# Patient Record
Sex: Male | Born: 1960 | Race: White | Hispanic: No | Marital: Married | Smoking: Never smoker
Health system: Southern US, Community
[De-identification: ages and names within clinical notes are randomized; demographics above are authoritative.]

## PROBLEM LIST (undated history)

## (undated) DIAGNOSIS — M1990 Unspecified osteoarthritis, unspecified site: Secondary | ICD-10-CM

## (undated) DIAGNOSIS — F419 Anxiety disorder, unspecified: Secondary | ICD-10-CM

## (undated) DIAGNOSIS — I10 Essential (primary) hypertension: Secondary | ICD-10-CM

## (undated) HISTORY — DX: Unspecified osteoarthritis, unspecified site: M19.90

## (undated) HISTORY — DX: Anxiety disorder, unspecified: F41.9

## (undated) HISTORY — DX: Essential (primary) hypertension: I10

---

## 2008-04-25 ENCOUNTER — Emergency Department (HOSPITAL_BASED_OUTPATIENT_CLINIC_OR_DEPARTMENT_OTHER): Admission: EM | Admit: 2008-04-25 | Discharge: 2008-04-25 | Payer: Self-pay | Admitting: Emergency Medicine

## 2009-09-30 ENCOUNTER — Emergency Department (HOSPITAL_BASED_OUTPATIENT_CLINIC_OR_DEPARTMENT_OTHER): Admission: EM | Admit: 2009-09-30 | Discharge: 2009-09-30 | Payer: Self-pay | Admitting: Emergency Medicine

## 2009-09-30 ENCOUNTER — Ambulatory Visit: Payer: Self-pay | Admitting: Diagnostic Radiology

## 2010-04-16 ENCOUNTER — Ambulatory Visit: Payer: Self-pay | Admitting: Diagnostic Radiology

## 2010-04-16 ENCOUNTER — Emergency Department (HOSPITAL_BASED_OUTPATIENT_CLINIC_OR_DEPARTMENT_OTHER): Admission: EM | Admit: 2010-04-16 | Discharge: 2010-04-16 | Payer: Self-pay | Admitting: Emergency Medicine

## 2010-07-12 ENCOUNTER — Emergency Department (HOSPITAL_BASED_OUTPATIENT_CLINIC_OR_DEPARTMENT_OTHER)
Admission: EM | Admit: 2010-07-12 | Discharge: 2010-07-12 | Payer: Self-pay | Source: Home / Self Care | Admitting: Emergency Medicine

## 2011-01-09 ENCOUNTER — Emergency Department (INDEPENDENT_AMBULATORY_CARE_PROVIDER_SITE_OTHER): Payer: 59

## 2011-01-09 ENCOUNTER — Emergency Department (HOSPITAL_BASED_OUTPATIENT_CLINIC_OR_DEPARTMENT_OTHER)
Admission: EM | Admit: 2011-01-09 | Discharge: 2011-01-09 | Disposition: A | Discharge status: Home / Self Care | Payer: 59 | Attending: Emergency Medicine | Admitting: Emergency Medicine

## 2011-01-09 DIAGNOSIS — M545 Low back pain: Secondary | ICD-10-CM

## 2011-01-09 DIAGNOSIS — IMO0002 Reserved for concepts with insufficient information to code with codable children: Secondary | ICD-10-CM | POA: Insufficient documentation

## 2011-01-09 DIAGNOSIS — W11XXXA Fall on and from ladder, initial encounter: Secondary | ICD-10-CM | POA: Insufficient documentation

## 2011-01-09 DIAGNOSIS — M549 Dorsalgia, unspecified: Secondary | ICD-10-CM

## 2011-01-09 DIAGNOSIS — Y9269 Other specified industrial and construction area as the place of occurrence of the external cause: Secondary | ICD-10-CM

## 2011-01-09 DIAGNOSIS — Y9289 Other specified places as the place of occurrence of the external cause: Secondary | ICD-10-CM | POA: Insufficient documentation

## 2011-01-09 DIAGNOSIS — M47814 Spondylosis without myelopathy or radiculopathy, thoracic region: Secondary | ICD-10-CM | POA: Insufficient documentation

## 2011-01-18 LAB — COMPREHENSIVE METABOLIC PANEL
ALT: 24 U/L (ref 0–53)
Albumin: 4.1 g/dL (ref 3.5–5.2)
Chloride: 103 mEq/L (ref 96–112)
GFR calc Af Amer: >60 mL/min (ref >60)
GFR calc non Af Amer: >60 mL/min (ref >60)
Potassium: 3.8 mEq/L (ref 3.5–5.1)
Total Protein: 7 g/dL (ref 6.0–8.3)

## 2011-01-18 LAB — POCT CARDIAC MARKERS
CKMB, poc: <1 ng/mL — ABNORMAL LOW (ref 1.0–8.0)
Myoglobin, poc: 33.3 ng/mL (ref 12–200)
Troponin i, poc: <0.05 ng/mL (ref 0.00–0.09)
Troponin i, poc: <0.05 ng/mL (ref 0.00–0.09)

## 2011-01-18 LAB — DIFFERENTIAL
Eosinophils Relative: 6 % — ABNORMAL HIGH (ref 0–5)
Lymphs Abs: 1 10*3/uL (ref 0.7–4.0)
Monocytes Absolute: 0.4 10*3/uL (ref 0.1–1.0)

## 2011-01-18 LAB — CBC
MCHC: 33.8 g/dL (ref 30.0–36.0)
MCV: 96.8 fL (ref 78.0–100.0)
RDW: 12.3 % (ref 11.5–15.5)
WBC: 4.2 10*3/uL (ref 4.0–10.5)

## 2011-01-18 LAB — D-DIMER, QUANTITATIVE: D-Dimer, Quant: <0.22 ug/mL-FEU (ref 0.00–0.48)

## 2011-07-30 LAB — CBC
MCHC: 35.6
MCV: 92.2
Platelets: 182
RBC: 4.41
RDW: 12.3

## 2011-07-30 LAB — DIFFERENTIAL
Basophils Relative: 0
Eosinophils Relative: 3
Lymphs Abs: 1
Monocytes Absolute: 0.4
Monocytes Relative: 10
Neutro Abs: 2.3

## 2011-07-30 LAB — BASIC METABOLIC PANEL
CO2: 28
Calcium: 9.8
Chloride: 102
Creatinine, Ser: 1.1
Sodium: 140

## 2011-07-30 LAB — POCT CARDIAC MARKERS
CKMB, poc: <1 — ABNORMAL LOW
Myoglobin, poc: 43.3
Troponin i, poc: <0.05

## 2012-11-15 ENCOUNTER — Encounter: Payer: Self-pay | Admitting: Family Medicine

## 2013-01-19 ENCOUNTER — Ambulatory Visit: Payer: 59 | Admitting: Family Medicine

## 2013-05-06 ENCOUNTER — Emergency Department (HOSPITAL_BASED_OUTPATIENT_CLINIC_OR_DEPARTMENT_OTHER)
Admission: EM | Admit: 2013-05-06 | Discharge: 2013-05-06 | Disposition: A | Discharge status: Home / Self Care | Payer: 59 | Attending: Emergency Medicine | Admitting: Emergency Medicine

## 2013-05-06 ENCOUNTER — Encounter (HOSPITAL_BASED_OUTPATIENT_CLINIC_OR_DEPARTMENT_OTHER): Payer: Self-pay

## 2013-05-06 DIAGNOSIS — Z79899 Other long term (current) drug therapy: Secondary | ICD-10-CM | POA: Insufficient documentation

## 2013-05-06 DIAGNOSIS — Y929 Unspecified place or not applicable: Secondary | ICD-10-CM | POA: Insufficient documentation

## 2013-05-06 DIAGNOSIS — R1310 Dysphagia, unspecified: Secondary | ICD-10-CM | POA: Insufficient documentation

## 2013-05-06 DIAGNOSIS — Y9389 Activity, other specified: Secondary | ICD-10-CM | POA: Insufficient documentation

## 2013-05-06 DIAGNOSIS — W57XXXA Bitten or stung by nonvenomous insect and other nonvenomous arthropods, initial encounter: Secondary | ICD-10-CM | POA: Insufficient documentation

## 2013-05-06 DIAGNOSIS — IMO0002 Reserved for concepts with insufficient information to code with codable children: Secondary | ICD-10-CM | POA: Insufficient documentation

## 2013-05-06 DIAGNOSIS — R0602 Shortness of breath: Secondary | ICD-10-CM | POA: Insufficient documentation

## 2013-05-06 DIAGNOSIS — T63461A Toxic effect of venom of wasps, accidental (unintentional), initial encounter: Secondary | ICD-10-CM | POA: Insufficient documentation

## 2013-05-06 DIAGNOSIS — S1096XA Insect bite of unspecified part of neck, initial encounter: Secondary | ICD-10-CM | POA: Insufficient documentation

## 2013-05-06 MED ORDER — LORAZEPAM 2 MG/ML IJ SOLN
INTRAMUSCULAR | Status: AC
  Filled 2013-05-06: qty 1

## 2013-05-06 MED ORDER — EPINEPHRINE 0.3 MG/0.3ML IJ SOAJ: 0.3000 mg | INTRAMUSCULAR | Status: AC | PRN

## 2013-05-06 MED ORDER — LORAZEPAM 2 MG/ML IJ SOLN
1.0000 mg | Freq: Once | INTRAMUSCULAR | Status: AC
  Administered 2013-05-06: 1 mg via INTRAVENOUS

## 2013-05-06 NOTE — ED Notes (Signed)
Per [facility], c/o sob, dizziness, and rash, was given benadryl, epi.  VSS per EMS.  In nad at time of arrival.

## 2013-05-06 NOTE — ED Notes (Signed)
Pt emotional, crying, concerned about numbness in face. VSS. Listening and education provided.

## 2013-05-06 NOTE — ED Provider Notes (Signed)
History    CSN: [id number] Arrival date & time 05/06/13  1216  First MD Initiated Contact with Patient 05/06/13 1234     Chief Complaint  Patient presents with  . Allergic Reaction   (Consider location/radiation/quality/duration/timing/severity/associated sxs/prior Treatment) Patient is a 52 y.o. male presenting with allergic reaction. The history is provided by the patient. No language interpreter was used.  Allergic Reaction Presenting symptoms: difficulty breathing, difficulty swallowing, itching and swelling   Difficulty breathing:    Severity:  Mild Swelling:    Location:  Neck   Timing:  Constant Severity:  Moderate Prior allergic episodes:  No prior episodes Context: insect bite/sting   Relieved by:  Nothing Worsened by:  Nothing tried Ineffective treatments:  None tried Pt complains of pain at site of bee stings.  Pt was seen at [facility].   Pt has had decadron, benadryl and Epi.   Pt is tearful and anxious.    History reviewed. No pertinent past medical history. History reviewed. No pertinent past surgical history. History reviewed. No pertinent family history. History  Substance Use Topics  . Smoking status: Never Smoker   . Smokeless tobacco: Never Used  . Alcohol Use: No    Review of Systems  HENT: Positive for trouble swallowing.   Respiratory: Positive for shortness of breath.   Skin: Positive for itching.  All other systems reviewed and are negative.    Allergies  Bee venom and Sulfa antibiotics  Home Medications   Current Outpatient Rx  Name  Route  Sig  Dispense  Refill  . dexamethasone (DECADRON) 10 MG/ML injection   Intramuscular   Inject 6 mg into the muscle once.         . diphenhydrAMINE (BENADRYL) 50 MG/ML injection   Intramuscular   Inject 50 mg into the muscle once.         Marland Kitchen EPINEPHrine (ADRENALIN) 0.1 MG/ML injection   Subcutaneous   Inject 0.1 mg into the skin once.         Marland Kitchen EPINEPHrine (EPIPEN) 0.3 mg/0.3  mL SOAJ   Intramuscular   Inject 0.3 mLs (0.3 mg total) into the muscle as needed.   1 Device   1    BP 161/81  Pulse 98  Temp(Src) 98.1 F (36.7 C) (Oral)  Resp 16  SpO2 99% Physical Exam  Nursing note and vitals reviewed. Constitutional: He is oriented to person, place, and time. He appears well-developed and well-nourished.  HENT:  Head: Normocephalic and atraumatic.  Right Ear: External ear normal.  Left Ear: External ear normal.  Nose: Nose normal.  Mouth/Throat: Oropharynx is clear and moist.  Eyes: EOM are normal. Pupils are equal, round, and reactive to light.  Neck: Normal range of motion. Neck supple.  Erythema back of neck  Cardiovascular: Normal rate, regular rhythm and normal heart sounds.   Pulmonary/Chest: Effort normal.  Abdominal: Soft.  Musculoskeletal: He exhibits tenderness.  Erythema left wrist    Neurological: He is alert and oriented to person, place, and time. He has normal reflexes.  Skin: There is erythema.  Psychiatric: He has a normal mood and affect.    ED Course  Procedures (including critical care time) Labs Reviewed - No data to display No results found. 1. Bee sting reaction, initial encounter     MDM  Pt observed x 4 hours post epi injection.   Pt given decadron in office.   Pt advised to continue benadryl..  Pt counseled on using epi pen and use.  Elson Areas, PA-C 05/06/13 1534  Lonia Skinner Dexter, PA-C 05/06/13 1535

## 2013-05-07 NOTE — ED Provider Notes (Signed)
Medical screening examination/treatment/procedure(s) were performed by non-physician practitioner and as supervising physician I was immediately available for consultation/collaboration.   Gwyneth Sprout, MD 05/07/13 [id number]

## 2019-03-21 ENCOUNTER — Emergency Department (HOSPITAL_BASED_OUTPATIENT_CLINIC_OR_DEPARTMENT_OTHER)

## 2019-03-21 ENCOUNTER — Emergency Department (HOSPITAL_BASED_OUTPATIENT_CLINIC_OR_DEPARTMENT_OTHER)
Admission: EM | Admit: 2019-03-21 | Discharge: 2019-03-21 | Disposition: A | Discharge status: Home / Self Care | Attending: Emergency Medicine | Admitting: Emergency Medicine

## 2019-03-21 ENCOUNTER — Encounter (HOSPITAL_BASED_OUTPATIENT_CLINIC_OR_DEPARTMENT_OTHER): Payer: Self-pay | Admitting: Emergency Medicine

## 2019-03-21 DIAGNOSIS — Y9259 Other trade areas as the place of occurrence of the external cause: Secondary | ICD-10-CM | POA: Diagnosis not present

## 2019-03-21 DIAGNOSIS — Y999 Unspecified external cause status: Secondary | ICD-10-CM | POA: Diagnosis not present

## 2019-03-21 DIAGNOSIS — S0990XA Unspecified injury of head, initial encounter: Secondary | ICD-10-CM | POA: Diagnosis present

## 2019-03-21 DIAGNOSIS — Y93E5 Activity, floor mopping and cleaning: Secondary | ICD-10-CM | POA: Diagnosis not present

## 2019-03-21 DIAGNOSIS — S060X0A Concussion without loss of consciousness, initial encounter: Secondary | ICD-10-CM | POA: Diagnosis not present

## 2019-03-21 DIAGNOSIS — W08XXXA Fall from other furniture, initial encounter: Secondary | ICD-10-CM | POA: Diagnosis not present

## 2019-03-21 DIAGNOSIS — S39012A Strain of muscle, fascia and tendon of lower back, initial encounter: Secondary | ICD-10-CM | POA: Diagnosis not present

## 2019-03-21 DIAGNOSIS — S161XXA Strain of muscle, fascia and tendon at neck level, initial encounter: Secondary | ICD-10-CM

## 2019-03-21 DIAGNOSIS — W19XXXA Unspecified fall, initial encounter: Secondary | ICD-10-CM

## 2019-03-21 NOTE — ED Triage Notes (Addendum)
Pt c/o fall from ladder approx 1hr pta. Pt states ladder tipped over, and he fell, hitting right side of head on a desk and landing on right shoulder on  the floor. Denies loc.Pt states fell approx 4-5 feet. Also c/o neck pain and lower back pain. Pt states he twisted when he fell

## 2019-03-21 NOTE — ED Notes (Signed)
Pt states he fell from ladder approx 1 hr pta. Pt states he hit his head on a desk, and twisted when falling, causing lower back pain. Pt also c/o right shoulder pain and neck pain. Denies loc, but is c/o acute onset nausea. Denies being dizzy at this time, states mild dizziness when getting up from fall for approx 5 mins. Pt c/o headache.

## 2019-03-21 NOTE — ED Notes (Signed)
Ambulatory, pt wheeled to room in wheelchair

## 2019-03-21 NOTE — ED Notes (Signed)
Patient transported to X-ray & CT °

## 2019-03-21 NOTE — ED Notes (Signed)
ED Provider at bedside. 

## 2019-03-21 NOTE — ED Provider Notes (Signed)
[facility] EMERGENCY DEPARTMENT Provider Note   CSN: [id number] Arrival date & time: 03/21/19  7673    History   Chief Complaint Chief Complaint  Patient presents with   Fall    HPI David Nash is a 58 y.o. male.     HPI Patient presents after a fall.  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  Hit his head and shoulder on a desk on the way down and then landed on the floor hitting the back of his head.  Also complaining of low back pain.  No loss consciousness.  However states that he does feel dizzy.  No confusion.  Not on anticoagulation.  Does have some mild back pain at times but never this severe. History reviewed. No pertinent past medical history.  There are no active problems to display for this patient.   History reviewed. No pertinent surgical history.      Home Medications    Prior to Admission medications   Medication Sig Start Date End Date Taking? Authorizing Provider  amLODipine (NORVASC) 10 MG tablet  03/16/19   [provider]  dexamethasone (DECADRON) 10 MG/ML injection Inject 6 mg into the muscle once.    [provider]  diphenhydrAMINE (BENADRYL) 50 MG/ML injection Inject 50 mg into the muscle once.    [provider]  EPINEPHrine (ADRENALIN) 0.1 MG/ML injection Inject 0.1 mg into the skin once.    [provider]  EPINEPHrine (EPIPEN) 0.3 mg/0.3 mL SOAJ Inject 0.3 mLs (0.3 mg total) into the muscle as needed. 05/06/13   Fransico Meadow, PA-C    Family History History reviewed. No pertinent family history.  Social History Social History   Tobacco Use   Smoking status: Never Smoker   Smokeless tobacco: Never Used  Substance Use Topics   Alcohol use: Yes   Drug use: No     Allergies   Bee venom and Sulfa antibiotics   Review of Systems Review of Systems  Constitutional: Negative for appetite change.  HENT: Negative for  congestion.   Respiratory: Negative for chest tightness.   Cardiovascular: Negative for chest pain.  Gastrointestinal: Negative for abdominal distention.  Genitourinary: Negative for flank pain.  Musculoskeletal: Positive for back pain and neck pain.  Skin: Negative for rash.  Neurological: Positive for dizziness. Negative for weakness.     Physical Exam Updated Vital Signs BP (!) 167/86 (BP Location: Right Arm)    Pulse 78    Temp 98.1 F (36.7 C) (Oral)    Resp 18    Ht 6\' 3"  (1.905 m)    Wt 104.3 kg    SpO2 100%    BMI 28.75 kg/m   Physical Exam Vitals signs and nursing note reviewed.  Neck:     Musculoskeletal: Neck supple.     Comments: Tenderness mildly over midline cervical spine Neurological:     Mental Status: He is alert.      ED Treatments / Results  Labs (all labs ordered are listed, but only abnormal results are displayed) Labs Reviewed - No data to display  EKG None  Radiology Dg Lumbar Spine Complete  Result Date: 03/21/2019 CLINICAL DATA:  Pt fell off a ladder today and is having low back pain no radiculopathy EXAM: LUMBAR SPINE - COMPLETE 4+ VIEW COMPARISON:  01/09/2011 FINDINGS: There is no evidence of lumbar spine fracture. Alignment is normal. Degenerative disc disease with  disc height loss at L5-S1. IMPRESSION: No acute osseous injury of the lumbar spine. Electronically Signed   By: Kathreen Devoid   On: 03/21/2019 09:53   Ct Head Wo Contrast  Result Date: 03/21/2019 CLINICAL DATA:  Fall from ladder 1 hour ago, initial encounter EXAM: CT HEAD WITHOUT CONTRAST CT CERVICAL SPINE WITHOUT CONTRAST TECHNIQUE: Multidetector CT imaging of the head and cervical spine was performed following the standard protocol without intravenous contrast. Multiplanar CT image reconstructions of the cervical spine were also generated. COMPARISON:  04/25/2008 FINDINGS: CT HEAD FINDINGS Brain: No evidence of acute infarction, hemorrhage, hydrocephalus, extra-axial collection or  mass lesion/mass effect. Vascular: No hyperdense vessel or unexpected calcification. Skull: Normal. Negative for fracture or focal lesion. Sinuses/Orbits: No acute finding. Other: None. CT CERVICAL SPINE FINDINGS Alignment: Within normal limits. Skull base and vertebrae: 7 cervical segments are well visualized. Vertebral body height is well maintained. No acute fracture or acute facet abnormality is noted. Mild disc space narrowing is noted at C5-6 with mild osteophytic changes. Mild facet hypertrophic changes are noted at this level as well. Soft tissues and spinal canal: No prevertebral fluid or swelling. No visible canal hematoma. Upper chest: No acute abnormality noted. Other: None IMPRESSION: CT of the head: No acute intracranial abnormality noted. CT of the cervical spine: Mild degenerative change without acute abnormality. Electronically Signed   By: Inez Catalina M.D.   On: 03/21/2019 09:35   Ct Cervical Spine Wo Contrast  Result Date: 03/21/2019 CLINICAL DATA:  Fall from ladder 1 hour ago, initial encounter EXAM: CT HEAD WITHOUT CONTRAST CT CERVICAL SPINE WITHOUT CONTRAST TECHNIQUE: Multidetector CT imaging of the head and cervical spine was performed following the standard protocol without intravenous contrast. Multiplanar CT image reconstructions of the cervical spine were also generated. COMPARISON:  04/25/2008 FINDINGS: CT HEAD FINDINGS Brain: No evidence of acute infarction, hemorrhage, hydrocephalus, extra-axial collection or mass lesion/mass effect. Vascular: No hyperdense vessel or unexpected calcification. Skull: Normal. Negative for fracture or focal lesion. Sinuses/Orbits: No acute finding. Other: None. CT CERVICAL SPINE FINDINGS Alignment: Within normal limits. Skull base and vertebrae: 7 cervical segments are well visualized. Vertebral body height is well maintained. No acute fracture or acute facet abnormality is noted. Mild disc space narrowing is noted at C5-6 with mild osteophytic  changes. Mild facet hypertrophic changes are noted at this level as well. Soft tissues and spinal canal: No prevertebral fluid or swelling. No visible canal hematoma. Upper chest: No acute abnormality noted. Other: None IMPRESSION: CT of the head: No acute intracranial abnormality noted. CT of the cervical spine: Mild degenerative change without acute abnormality. Electronically Signed   By: Inez Catalina M.D.   On: 03/21/2019 09:35    Procedures Procedures (including critical care time)  Medications Ordered in ED Medications - No data to display   Initial Impression / Assessment and Plan / ED Course  I have reviewed the triage vital signs and the nursing notes.  Pertinent labs & imaging results that were available during my care of the patient were reviewed by me and considered in my medical decision making (see chart for details).        Patient with fall.  Imaging reassuring.  Doubt severe injury.  Discharge home with outpatient follow-up.  Final Clinical Impressions(s) / ED Diagnoses   Final diagnoses:  Fall, initial encounter  Concussion without loss of consciousness, initial encounter  Cervical strain, acute, initial encounter  Lumbar strain, initial encounter    ED Discharge Orders  None       Davonna Belling, MD 03/21/19 1153

## 2020-08-23 IMAGING — CR LUMBAR SPINE - COMPLETE 4+ VIEW
5 series · 5 of 5 positions shown · non-contrast
Comparison: 01/09/2011

CLINICAL DATA: Pt fell off a ladder today and is having low back
pain no radiculopathy

EXAM:
LUMBAR SPINE - COMPLETE 4+ VIEW

[t l-spine a.p.]
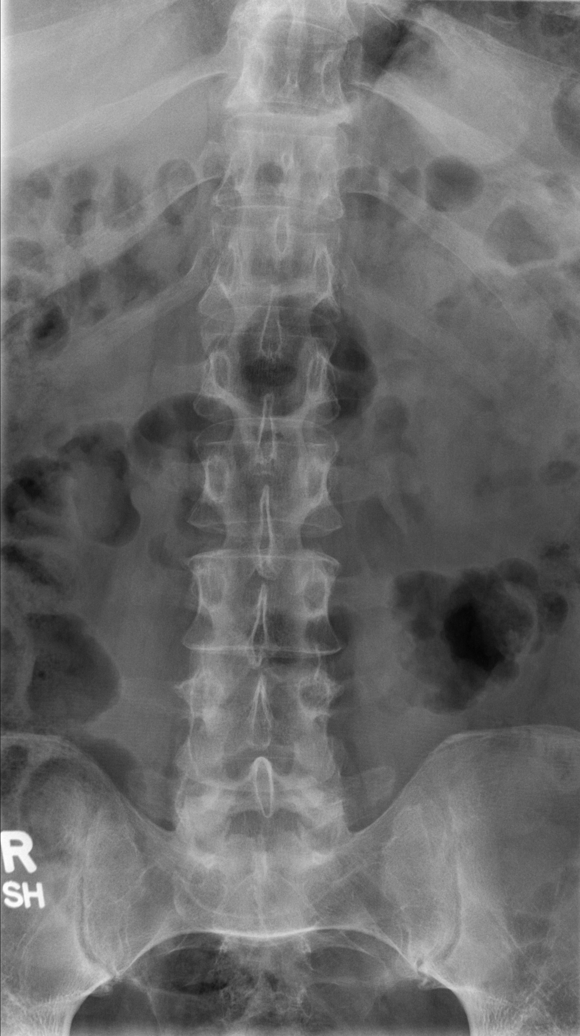

[t l-spine oblique exposure (1 of 2)]
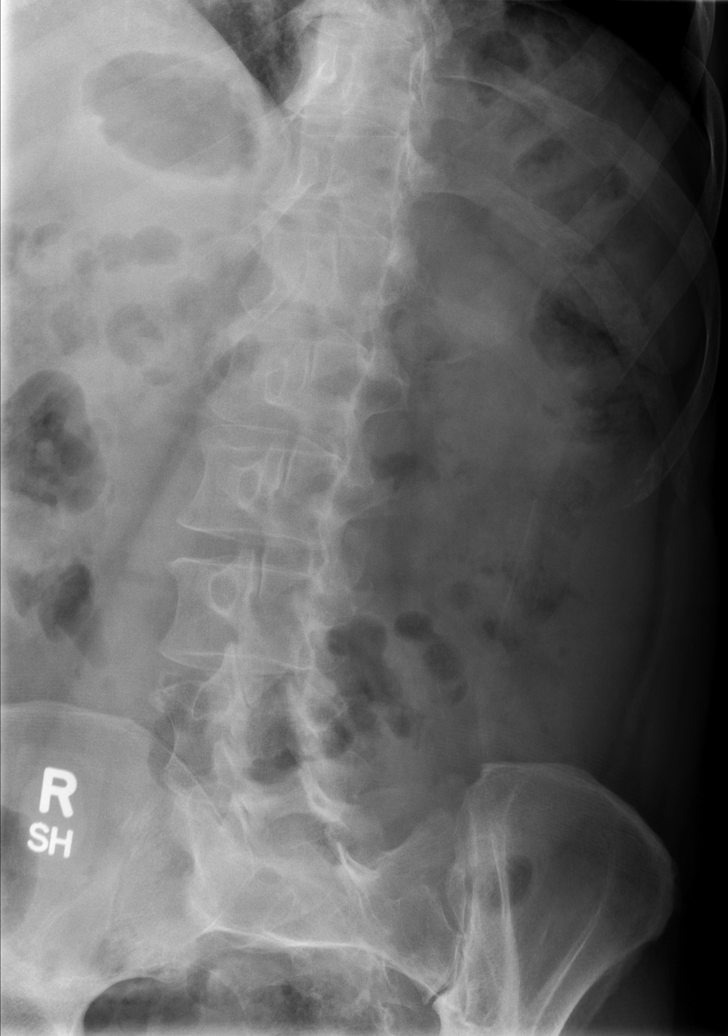

[t l-spine oblique exposure (2 of 2)]
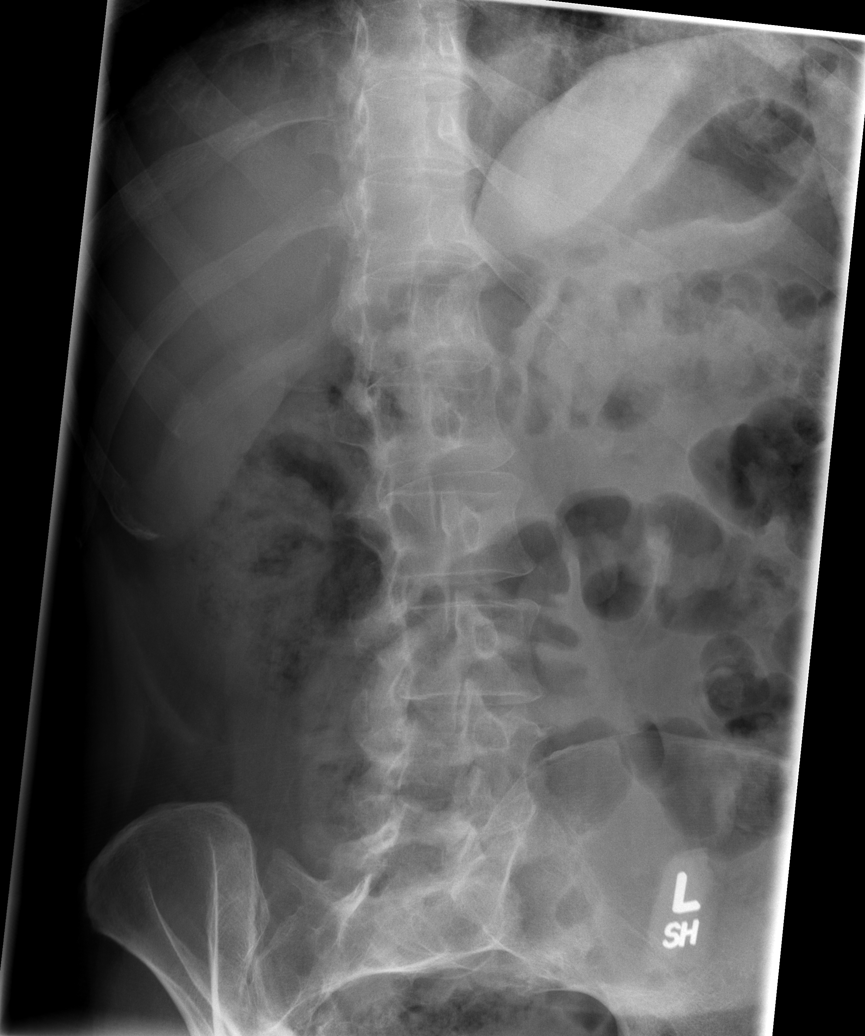

[t l-spine lat]
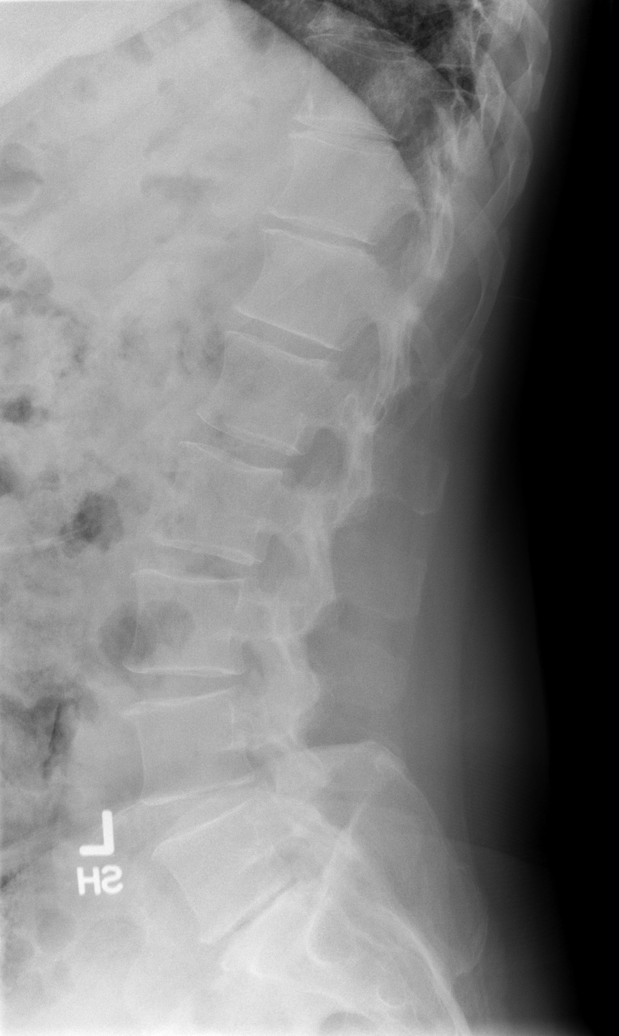

[t l-spine l5-s1 spot]
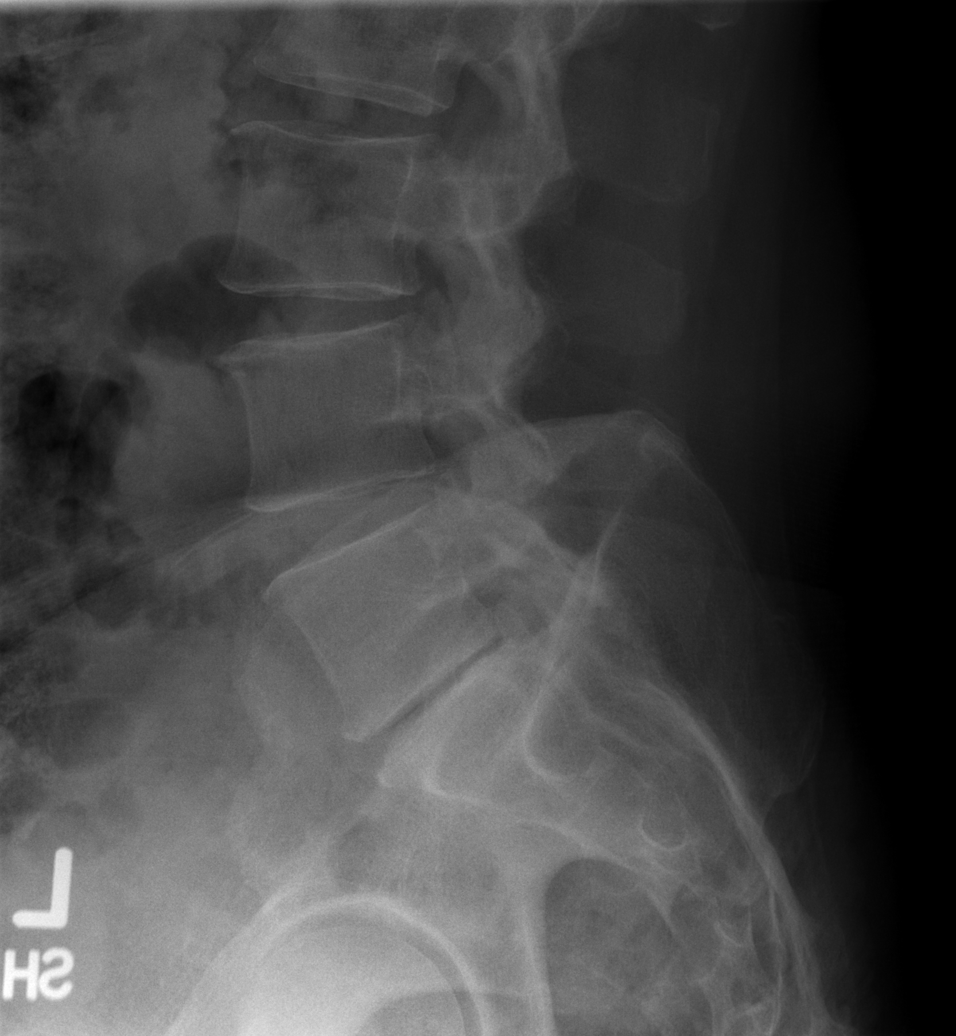

[5 of 5 positions shown; findings below may reference images not displayed]

FINDINGS: There is no evidence of lumbar spine fracture. Alignment is normal.
Degenerative disc disease with disc height loss at L5-S1.
IMPRESSION: No acute osseous injury of the lumbar spine.

## 2020-08-23 IMAGING — CT CT CERVICAL SPINE WITHOUT CONTRAST
4 of 7 series · 14 of 33 positions shown, 16 images · non-contrast
Comparison: 04/25/2008

CLINICAL DATA: Fall from ladder 1 hour ago, initial encounter

EXAM:
CT HEAD WITHOUT CONTRAST
CT CERVICAL SPINE WITHOUT CONTRAST
TECHNIQUE: Multidetector CT imaging of the head and cervical spine was
performed following the standard protocol without intravenous
contrast. Multiplanar CT image reconstructions of the cervical spine
were also generated.

[Series 7: c_spine 2.0 i30s 3 · axial · 0.32mm/px · z∈[-249,-165]mm · 3 of 106 slices shown]
[im 22/106  bone]
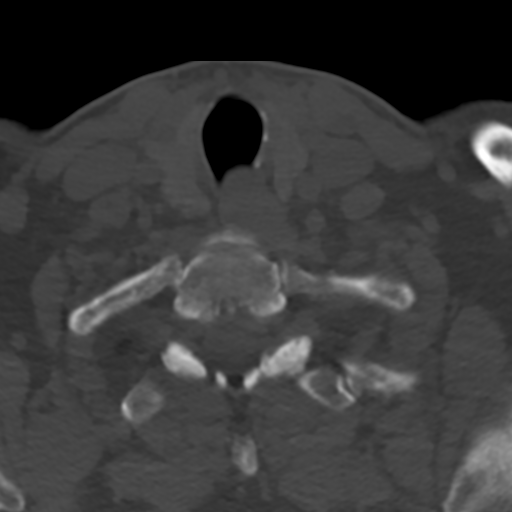
[im 43/106  bone]
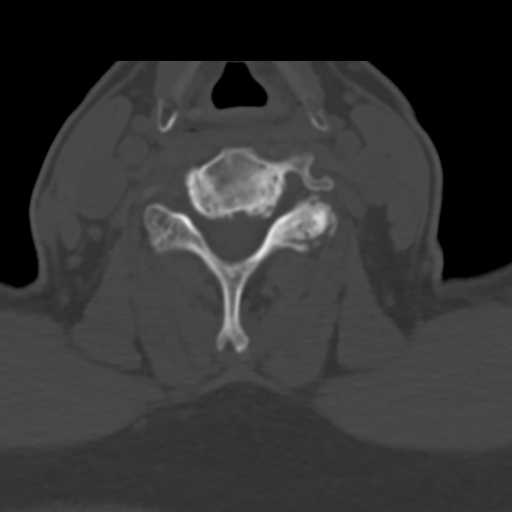
[im 64/106  bone]
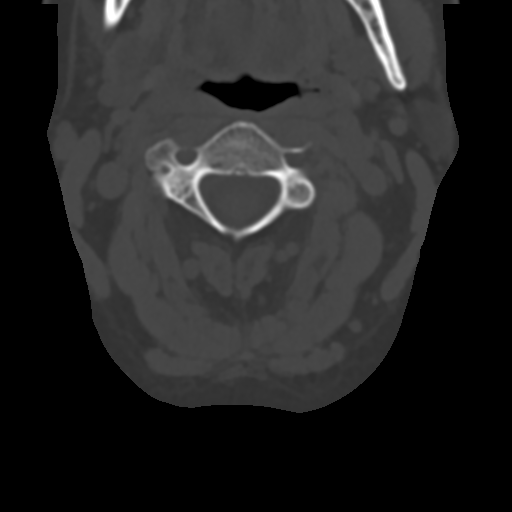

[Series 9: coronals · coronal · 0.30mm/px · 1 of 60 slices shown]
[im 30/60  bone]
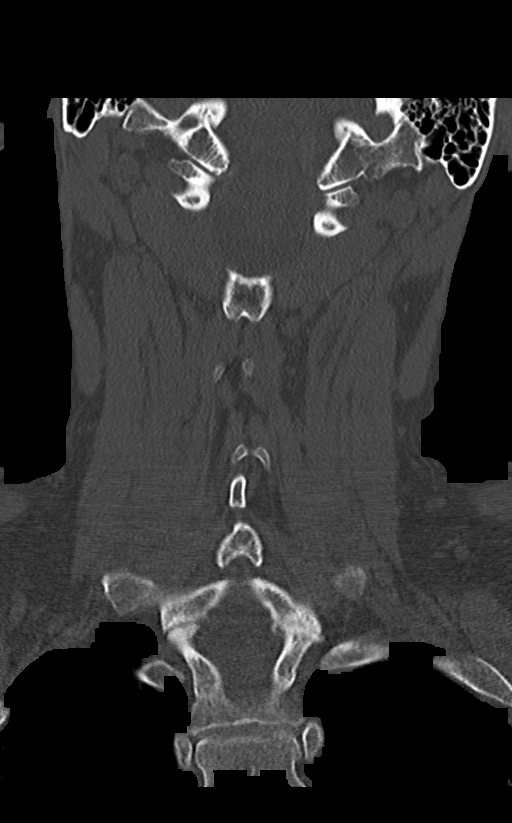

[Series 10: sagittals · sagittal · 0.30mm/px · 5 of 61 slices shown]
[im 11/61  bone]
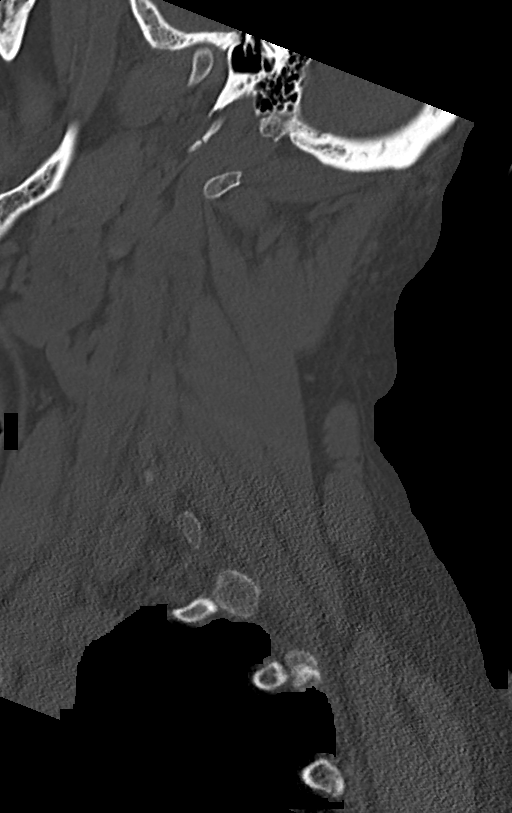
[im 21/61  bone]
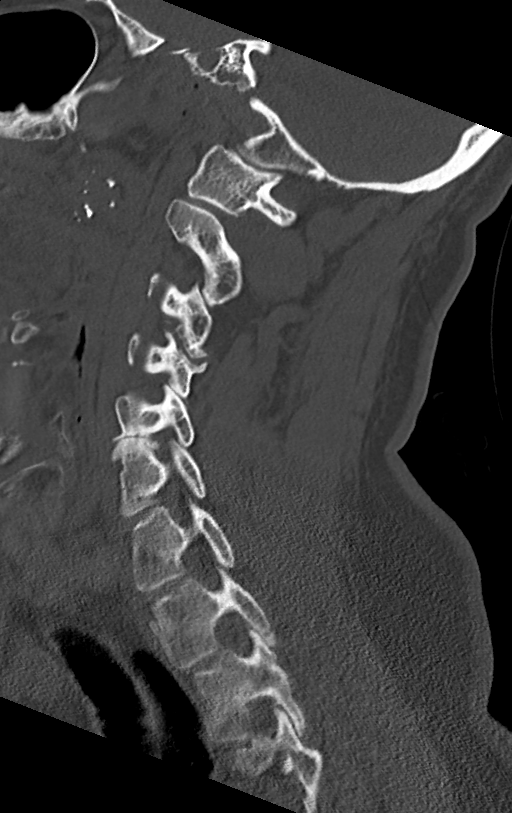
[im 31/61  bone]
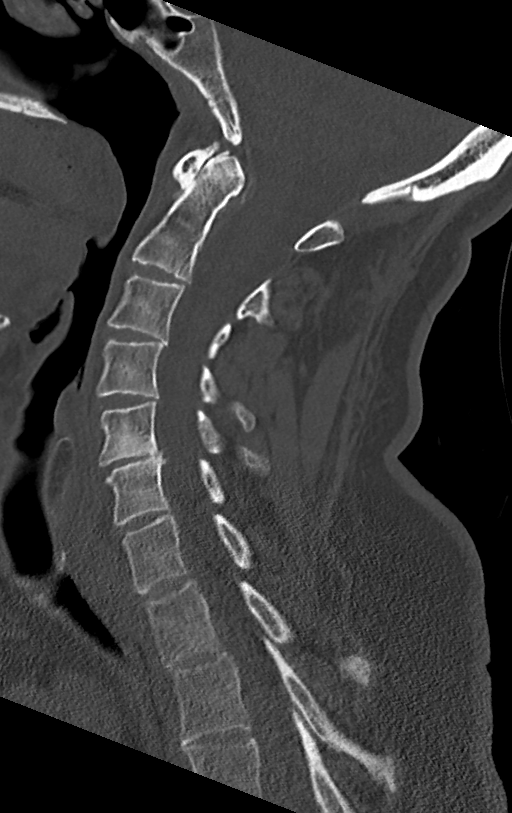
[im 41/61  bone]
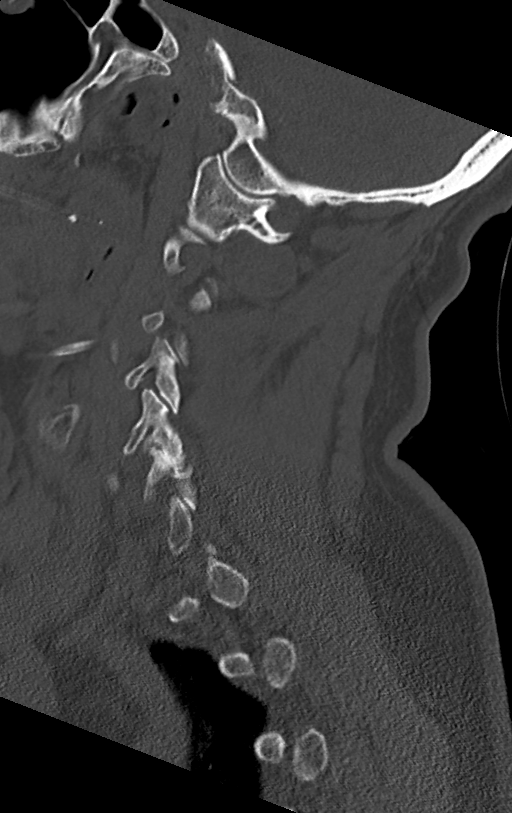
[im 51/61  bone]
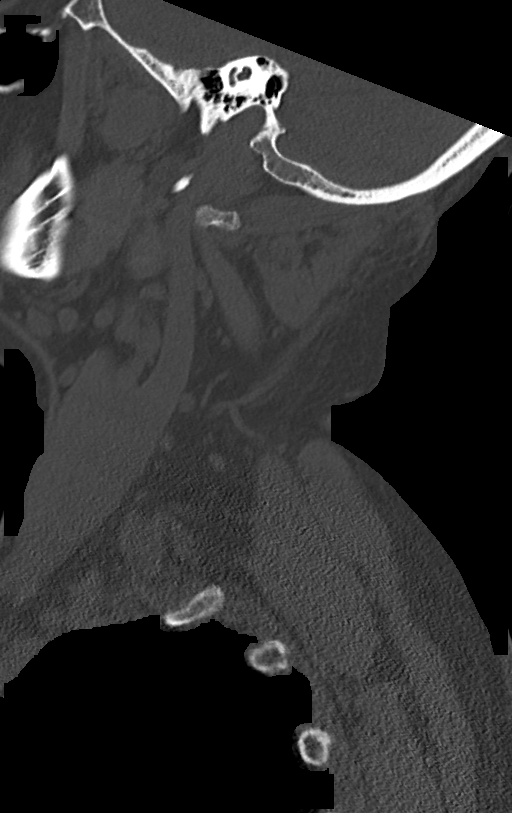

[Series 11: orthogonals · axial · 0.23mm/px · z∈[-285,-125]mm · 5 of 126 slices shown, 7 images]
[im 21/126  soft-tissue]
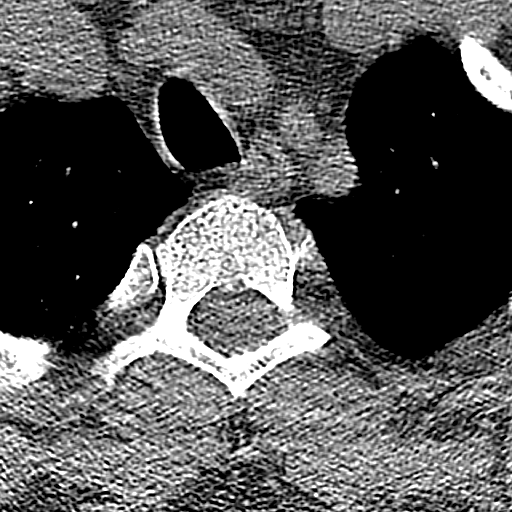
[im 21/126  bone]
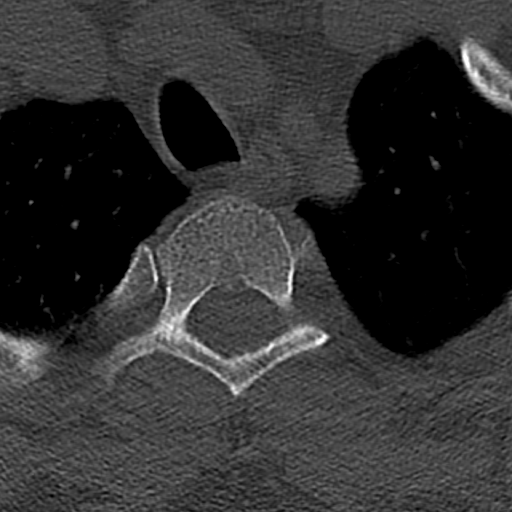
[im 42/126  bone]
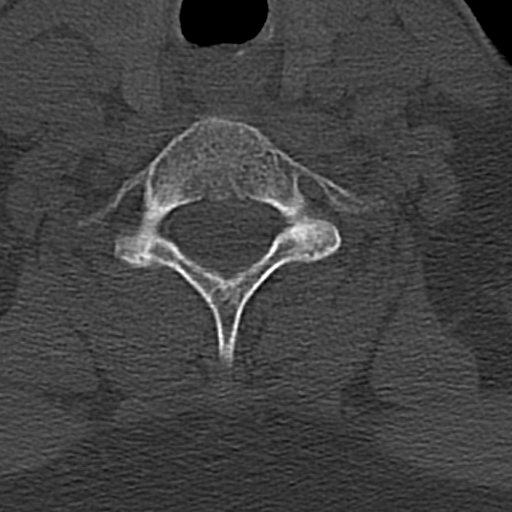
[im 63/126  bone]
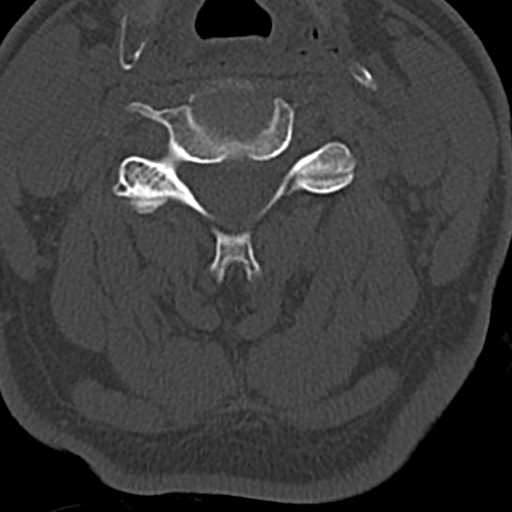
[im 84/126  bone]
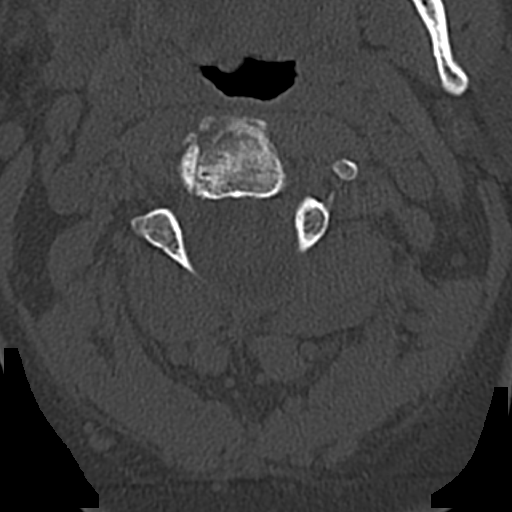
[im 105/126  soft-tissue]
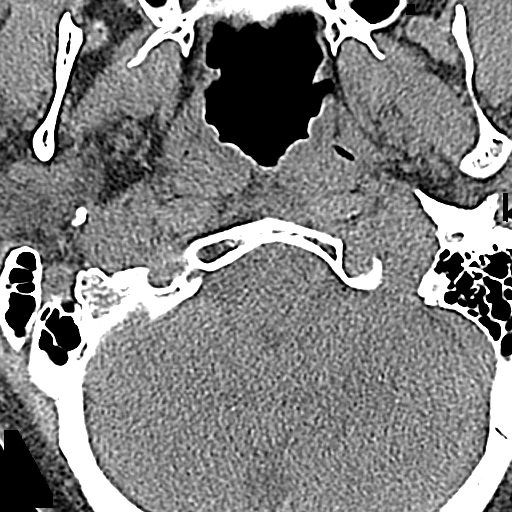
[im 105/126  bone]
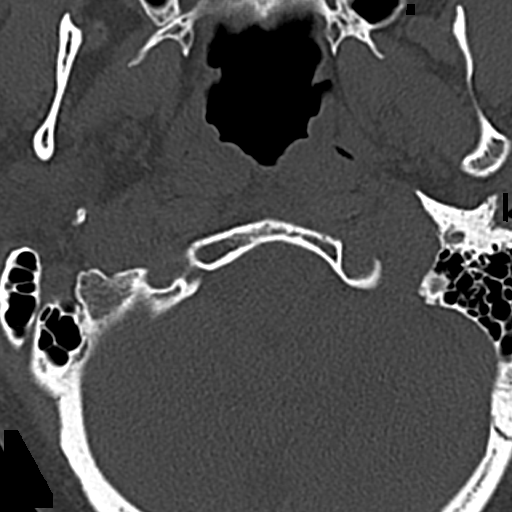

[14 of 33 positions shown; findings below may reference images not displayed]

FINDINGS: CT HEAD FINDINGS

Brain: No evidence of acute infarction, hemorrhage, hydrocephalus,
extra-axial collection or mass lesion/mass effect.

Vascular: No hyperdense vessel or unexpected calcification.

Skull: Normal. Negative for fracture or focal lesion.

Sinuses/Orbits: No acute finding.

Other: None.

CT CERVICAL SPINE FINDINGS

Alignment: Within normal limits.

Skull base and vertebrae: 7 cervical segments are well visualized.
Vertebral body height is well maintained. No acute fracture or acute
facet abnormality is noted. Mild disc space narrowing is noted at
C5-6 with mild osteophytic changes. Mild facet hypertrophic changes
are noted at this level as well.

Soft tissues and spinal canal: No prevertebral fluid or swelling. No
visible canal hematoma.

Upper chest: No acute abnormality noted.

Other: None
IMPRESSION: CT of the head: No acute intracranial abnormality noted.

CT of the cervical spine: Mild degenerative change without acute
abnormality.

## 2021-04-04 ENCOUNTER — Encounter: Payer: Self-pay | Admitting: Family Medicine

## 2021-04-04 ENCOUNTER — Ambulatory Visit (INDEPENDENT_AMBULATORY_CARE_PROVIDER_SITE_OTHER): Payer: Managed Care, Other (non HMO) | Admitting: Family Medicine

## 2021-04-04 ENCOUNTER — Other Ambulatory Visit: Payer: Self-pay

## 2021-04-04 VITALS — BP 148/84 | HR 87 | Temp 97.4°F | Ht 75.0 in | Wt 213.4 lb

## 2021-04-04 DIAGNOSIS — Z0000 Encounter for general adult medical examination without abnormal findings: Secondary | ICD-10-CM

## 2021-04-04 DIAGNOSIS — I10 Essential (primary) hypertension: Secondary | ICD-10-CM | POA: Diagnosis not present

## 2021-04-04 DIAGNOSIS — N5201 Erectile dysfunction due to arterial insufficiency: Secondary | ICD-10-CM

## 2021-04-04 DIAGNOSIS — Z125 Encounter for screening for malignant neoplasm of prostate: Secondary | ICD-10-CM | POA: Diagnosis not present

## 2021-04-04 LAB — CBC
HCT: 41.8 % (ref 39.0–52.0)
Hemoglobin: 14.2 g/dL (ref 13.0–17.0)
MCHC: 34 g/dL (ref 30.0–36.0)
MCV: 96.1 fl (ref 78.0–100.0)
Platelets: 258 10*3/uL (ref 150.0–400.0)
RBC: 4.35 Mil/uL (ref 4.22–5.81)
RDW: 13.4 % (ref 11.5–15.5)
WBC: 4.1 10*3/uL (ref 4.0–10.5)

## 2021-04-04 LAB — URINALYSIS, ROUTINE W REFLEX MICROSCOPIC
Bilirubin Urine: NEGATIVE
Hgb urine dipstick: NEGATIVE
Ketones, ur: NEGATIVE
Leukocytes,Ua: NEGATIVE
Nitrite: NEGATIVE
RBC / HPF: NONE SEEN (ref 0–?)
Specific Gravity, Urine: <=1.005 — AB (ref 1.000–1.030)
Total Protein, Urine: NEGATIVE
Urine Glucose: NEGATIVE
Urobilinogen, UA: 0.2 (ref 0.0–1.0)
pH: 7 (ref 5.0–8.0)

## 2021-04-04 LAB — PSA: PSA: 2.07 ng/mL (ref 0.10–4.00)

## 2021-04-04 MED ORDER — AMLODIPINE BESYLATE 10 MG PO TABS: 10.0000 mg | ORAL_TABLET | Freq: Every day | ORAL | 1 refills | Status: DC

## 2021-04-04 MED ORDER — LISINOPRIL 5 MG PO TABS: 5.0000 mg | ORAL_TABLET | Freq: Every day | ORAL | 3 refills | Status: DC

## 2021-04-04 MED ORDER — SILDENAFIL CITRATE 20 MG PO TABS: ORAL_TABLET | ORAL | 1 refills | Status: DC

## 2021-04-04 NOTE — Progress Notes (Signed)
New Patient Office Visit  Subjective:  Patient ID: David Nash, male    DOB: 1961/09/21  Age: 59 y.o. MRN: [id number]  CC:  Chief Complaint  Patient presents with  . Establish Care    NP/establish care refill on medications. Patient fasting for labs.     HPI David Nash presents for establishment of care by way of transfer for.  He has a history of hypertension.  Blood pressures at home have been running in the upper 130s over 80s.  He is fasting today.  He is up-to-date on his health maintenance per chart review.  He has mild issues with ED.  He uses about 4 Revatio as needed.  Is experience low T in the past.  He is married.  Continues to work.  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  He has seen the dermatologist for follow-up of benign nevi.  Past Medical History:  Diagnosis Date  . Anxiety   . Arthritis   . Hypertension     No past surgical history on file.  Family History  Adopted: Yes  Family history unknown: Yes    Social History   Socioeconomic History  . Marital status: Married    Spouse name: Not on file  . Number of children: Not on file  . Years of education: Not on file  . Highest education level: Not on file  Occupational History  . Not on file  Tobacco Use  . Smoking status: Never Smoker  . Smokeless tobacco: Never Used  Vaping Use  . Vaping Use: Never used  Substance and Sexual Activity  . Alcohol use: Yes    Alcohol/week: 1.0 standard drink    Types: 1 Glasses of wine per week  . Drug use: No  . Sexual activity: Yes  Other Topics Concern  . Not on file  Social History Narrative  . Not on file   Social Determinants of Health   Financial Resource Strain: Not on file  Food Insecurity: Not on file  Transportation Needs: Not on file  Physical Activity: Not on file  Stress: Not on file  Social Connections: Not on file  Intimate Partner Violence: Not on file     ROS Review of Systems  Constitutional: Negative.   HENT: Negative.   Eyes: Negative for photophobia and visual disturbance.  Respiratory: Negative.   Cardiovascular: Negative.   Gastrointestinal: Negative.   Endocrine: Negative for polyphagia and polyuria.  Genitourinary: Negative.   Musculoskeletal: Negative for gait problem and joint swelling.  Neurological: Negative for speech difficulty and weakness.  Hematological: Does not bruise/bleed easily.  Psychiatric/Behavioral: Negative.    Depression screen [facility] 2/9 04/04/2021  Decreased Interest 0  Down, Depressed, Hopeless 0  PHQ - 2 Score 0    Objective:   Today's Vitals: BP (!) 148/84   Pulse 87   Temp (!) 97.4 F (36.3 C) (Temporal)   Ht 6\' 3"  (1.905 m)   Wt 213 lb 6.4 oz (96.8 kg)   SpO2 98%   BMI 26.67 kg/m   Physical Exam Vitals and nursing note reviewed.  Constitutional:      General: He is not in acute distress.    Appearance: Normal appearance. He is not ill-appearing, toxic-appearing or diaphoretic.  HENT:     Head: Normocephalic and atraumatic.     Right Ear: Tympanic membrane, ear canal and external ear normal.     Left Ear: Tympanic membrane,  ear canal and external ear normal.     Mouth/Throat:     Mouth: Mucous membranes are moist.     Pharynx: Oropharynx is clear. No oropharyngeal exudate or posterior oropharyngeal erythema.  Eyes:     General: No scleral icterus.       Right eye: No discharge.        Left eye: No discharge.     Extraocular Movements: Extraocular movements intact.     Conjunctiva/sclera: Conjunctivae normal.     Pupils: Pupils are equal, round, and reactive to light.  Neck:     Vascular: No carotid bruit.  Cardiovascular:     Rate and Rhythm: Normal rate and regular rhythm.  Pulmonary:     Effort: Pulmonary effort is normal.     Breath sounds: Normal breath sounds.  Abdominal:     General: Abdomen is flat. Bowel sounds are normal. There is no distension.     Palpations:  There is no mass.     Tenderness: There is no abdominal tenderness. There is no guarding or rebound.     Hernia: No hernia is present.  Musculoskeletal:     Cervical back: No rigidity or tenderness.  Lymphadenopathy:     Cervical: No cervical adenopathy.  Skin:    General: Skin is warm and dry.       Neurological:     Mental Status: He is alert and oriented to person, place, and time.     Assessment & Plan:   Problem List Items Addressed This Visit   None   Visit Diagnoses    Essential hypertension    -  Primary   Relevant Medications   amLODipine (NORVASC) 10 MG tablet   sildenafil (REVATIO) 20 MG tablet   lisinopril (ZESTRIL) 5 MG tablet   Other Relevant Orders   CBC   Comprehensive metabolic panel   Urinalysis, Routine w reflex microscopic   Healthcare maintenance       Relevant Orders   Lipid panel   PSA   Erectile dysfunction due to arterial insufficiency       Relevant Medications   amLODipine (NORVASC) 10 MG tablet   sildenafil (REVATIO) 20 MG tablet   lisinopril (ZESTRIL) 5 MG tablet      Outpatient Encounter Medications as of 04/04/2021  Medication Sig  . ascorbic acid (VITAMIN C) 100 MG tablet Take by mouth.  . calcium-vitamin D 250-100 MG-UNIT tablet Take 1 tablet by mouth every morning.  Marland Kitchen EPINEPHrine (EPIPEN) 0.3 mg/0.3 mL SOAJ Inject 0.3 mLs (0.3 mg total) into the muscle as needed.  Marland Kitchen lisinopril (ZESTRIL) 5 MG tablet Take 1 tablet (5 mg total) by mouth daily.  . Multiple Vitamins-Minerals (THERA-M) TABS Take 1 tablet by mouth daily.  . Omega-3 Fatty Acids (FISH OIL) 1000 MG CAPS Take by mouth.  . [DISCONTINUED] amLODipine (NORVASC) 10 MG tablet   . [DISCONTINUED] sildenafil (REVATIO) 20 MG tablet Take 1-5 tablets daily as needed  . amLODipine (NORVASC) 10 MG tablet Take 1 tablet (10 mg total) by mouth daily.  . sildenafil (REVATIO) 20 MG tablet Take 3-5 45 minutes prior  . [DISCONTINUED] dexamethasone (DECADRON) 10 MG/ML injection Inject 6 mg into  the muscle once.  . [DISCONTINUED] diphenhydrAMINE (BENADRYL) 50 MG/ML injection Inject 50 mg into the muscle once.  . [DISCONTINUED] EPINEPHrine (ADRENALIN) 0.1 MG/ML injection Inject 0.1 mg into the skin once.   No facility-administered encounter medications on file as of 04/04/2021.    Follow-up: Return Start lisinopril.  Return in  3 to 6 months.  Expect blood pressures in the low 130s/80s..  Agrees to start lisinopril at low dose to bring his pressure closer to the low 130s over low 80s.  Asked him to look out for cough.  He was given information on blood pressure and lisinopril.  Also given information on health maintenance and disease prevention. Libby Maw, MD

## 2021-04-04 NOTE — Patient Instructions (Signed)
Health Maintenance, Male Adopting a healthy lifestyle and getting preventive care are important in promoting health and wellness. Ask your health care provider about:  The right schedule for you to have regular tests and exams.  Things you can do on your own to prevent diseases and keep yourself healthy. What should I know about diet, weight, and exercise? Eat a healthy diet  Eat a diet that includes plenty of vegetables, fruits, low-fat dairy products, and lean protein.  Do not eat a lot of foods that are high in solid fats, added sugars, or sodium.   Maintain a healthy weight Body mass index (BMI) is a measurement that can be used to identify possible weight problems. It estimates body fat based on height and weight. Your health care provider can help determine your BMI and help you achieve or maintain a healthy weight. Get regular exercise Get regular exercise. This is one of the most important things you can do for your health. Most adults should:  Exercise for at least 150 minutes each week. The exercise should increase your heart rate and make you sweat (moderate-intensity exercise).  Do strengthening exercises at least twice a week. This is in addition to the moderate-intensity exercise.  Spend less time sitting. Even light physical activity can be beneficial. Watch cholesterol and blood lipids Have your blood tested for lipids and cholesterol at 59 years of age, then have this test every 5 years. You may need to have your cholesterol levels checked more often if:  Your lipid or cholesterol levels are high.  You are older than 59 years of age.  You are at high risk for heart disease. What should I know about cancer screening? Many types of cancers can be detected early and may often be prevented. Depending on your health history and family history, you may need to have cancer screening at various ages. This may include screening for:  Colorectal cancer.  Prostate  cancer.  Skin cancer.  Lung cancer. What should I know about heart disease, diabetes, and high blood pressure? Blood pressure and heart disease  High blood pressure causes heart disease and increases the risk of stroke. This is more likely to develop in people who have high blood pressure readings, are of African descent, or are overweight.  Talk with your health care provider about your target blood pressure readings.  Have your blood pressure checked: ? Every 3-5 years if you are 18-39 years of age. ? Every year if you are 40 years old or older.  If you are between the ages of 65 and 75 and are a current or former smoker, ask your health care provider if you should have a one-time screening for abdominal aortic aneurysm (AAA). Diabetes Have regular diabetes screenings. This checks your fasting blood sugar level. Have the screening done:  Once every three years after age 45 if you are at a normal weight and have a low risk for diabetes.  More often and at a younger age if you are overweight or have a high risk for diabetes. What should I know about preventing infection? Hepatitis B If you have a higher risk for hepatitis B, you should be screened for this virus. Talk with your health care provider to find out if you are at risk for hepatitis B infection. Hepatitis C Blood testing is recommended for:  Everyone born from 1945 through 1965.  Anyone with known risk factors for hepatitis C. Sexually transmitted infections (STIs)  You should be screened each   year for STIs, including gonorrhea and chlamydia, if: ? You are sexually active and are younger than 59 years of age. ? You are older than 59 years of age and your health care provider tells you that you are at risk for this type of infection. ? Your sexual activity has changed since you were last screened, and you are at increased risk for chlamydia or gonorrhea. Ask your health care provider if you are at risk.  Ask your  health care provider about whether you are at high risk for HIV. Your health care provider may recommend a prescription medicine to help prevent HIV infection. If you choose to take medicine to prevent HIV, you should first get tested for HIV. You should then be tested every 3 months for as long as you are taking the medicine. Follow these instructions at home: Lifestyle  Do not use any products that contain nicotine or tobacco, such as cigarettes, e-cigarettes, and chewing tobacco. If you need help quitting, ask your health care provider.  Do not use street drugs.  Do not share needles.  Ask your health care provider for help if you need support or information about quitting drugs. Alcohol use  Do not drink alcohol if your health care provider tells you not to drink.  If you drink alcohol: ? Limit how much you have to 0-2 drinks a day. ? Be aware of how much alcohol is in your drink. In the [place] one drink equals one 12 oz bottle of beer (355 mL), one 5 oz glass of wine (148 mL), or one 1 oz glass of hard liquor (44 mL). General instructions  Schedule regular health, dental, and eye exams.  Stay current with your vaccines.  Tell your health care provider if: ? You often feel depressed. ? You have ever been abused or do not feel safe at home. Summary  Adopting a healthy lifestyle and getting preventive care are important in promoting health and wellness.  Follow your health care provider's instructions about healthy diet, exercising, and getting tested or screened for diseases.  Follow your health care provider's instructions on monitoring your cholesterol and blood pressure. This information is not intended to replace advice given to you by your health care provider. Make sure you discuss any questions you have with your health care provider. Document Revised: 10/12/2018 Document Reviewed: 10/12/2018 Elsevier Patient Education  2021 [place] Years  Old, Male Preventive care refers to lifestyle choices and visits with your health care provider that can promote health and wellness. This includes:  A yearly physical exam. This is also called an annual wellness visit.  Regular dental and eye exams.  Immunizations.  Screening for certain conditions.  Healthy lifestyle choices, such as: ? Eating a healthy diet. ? Getting regular exercise. ? Not using drugs or products that contain nicotine and tobacco. ? Limiting alcohol use. What can I expect for my preventive care visit? Physical exam Your health care provider will check your:  Height and weight. These may be used to calculate your BMI (body mass index). BMI is a measurement that tells if you are at a healthy weight.  Heart rate and blood pressure.  Body temperature.  Skin for abnormal spots. Counseling Your health care provider may ask you questions about your:  Past medical problems.  Family's medical history.  Alcohol, tobacco, and drug use.  Emotional well-being.  Home life and relationship well-being.  Sexual activity.  Diet, exercise, and sleep habits.  Work and work Statistician.  Access to firearms. What immunizations do I need? Vaccines are usually given at various ages, according to a schedule. Your health care provider will recommend vaccines for you based on your age, medical history, and lifestyle or other factors, such as travel or where you work.   What tests do I need? Blood tests  Lipid and cholesterol levels. These may be checked every 5 years, or more often if you are over 44 years old.  Hepatitis C test.  Hepatitis B test. Screening  Lung cancer screening. You may have this screening every year starting at age 77 if you have a 30-pack-year history of smoking and currently smoke or have quit within the past 15 years.  Prostate cancer screening. Recommendations will vary depending on your family history and other risks.  Genital exam  to check for testicular cancer or hernias.  Colorectal cancer screening. ? All adults should have this screening starting at age 36 and continuing until age 28. ? Your health care provider may recommend screening at age 16 if you are at increased risk. ? You will have tests every 1-10 years, depending on your results and the type of screening test.  Diabetes screening. ? This is done by checking your blood sugar (glucose) after you have not eaten for a while (fasting). ? You may have this done every 1-3 years.  STD (sexually transmitted disease) testing, if you are at risk. Follow these instructions at home: Eating and drinking  Eat a diet that includes fresh fruits and vegetables, whole grains, lean protein, and low-fat dairy products.  Take vitamin and mineral supplements as recommended by your health care provider.  Do not drink alcohol if your health care provider tells you not to drink.  If you drink alcohol: ? Limit how much you have to 0-2 drinks a day. ? Be aware of how much alcohol is in your drink. In the [place] one drink equals one 12 oz bottle of beer (355 mL), one 5 oz glass of wine (148 mL), or one 1 oz glass of hard liquor (44 mL).   Lifestyle  Take daily care of your teeth and gums. Brush your teeth every morning and night with fluoride toothpaste. Floss one time each day.  Stay active. Exercise for at least 30 minutes 5 or more days each week.  Do not use any products that contain nicotine or tobacco, such as cigarettes, e-cigarettes, and chewing tobacco. If you need help quitting, ask your health care provider.  Do not use drugs.  If you are sexually active, practice safe sex. Use a condom or other form of protection to prevent STIs (sexually transmitted infections).  If told by your health care provider, take low-dose aspirin daily starting at age 10.  Find healthy ways to cope with stress, such as: ? Meditation, yoga, or listening to  music. ? Journaling. ? Talking to a trusted person. ? Spending time with friends and family. Safety  Always wear your seat belt while driving or riding in a vehicle.  Do not drive: ? If you have been drinking alcohol. Do not ride with someone who has been drinking. ? When you are tired or distracted. ? While texting.  Wear a helmet and other protective equipment during sports activities.  If you have firearms in your house, make sure you follow all gun safety procedures. What's next?  Go to your health care provider once a year for an annual wellness visit.  Ask your health  care provider how often you should have your eyes and teeth checked.  Stay up to date on all vaccines. This information is not intended to replace advice given to you by your health care provider. Make sure you discuss any questions you have with your health care provider. Document Revised: 07/18/2019 Document Reviewed: 10/13/2018 Elsevier Patient Education  2021 [place].  How to Take Your Blood Pressure Blood pressure is a measurement of how strongly your blood is pressing against the walls of your arteries. Arteries are blood vessels that carry blood from your heart throughout your body. Your health care provider takes your blood pressure at each office visit. You can also take your own blood pressure at home with a blood pressure monitor. You may need to take your own blood pressure to:  Confirm a diagnosis of high blood pressure (hypertension).  Monitor your blood pressure over time.  Make sure your blood pressure medicine is working. Supplies needed:  Blood pressure monitor.  Dining room chair to sit in.  Table or desk.  Small notebook and pencil or pen. How to prepare To get the most accurate reading, avoid the following for 30 minutes before you check your blood pressure:  Drinking caffeine.  Drinking alcohol.  Eating.  Smoking.  Exercising. Five minutes before you check your  blood pressure:  Use the bathroom and urinate so that you have an empty bladder.  Sit quietly in a dining room chair. Do not sit in a soft couch or an armchair. Do not talk. How to take your blood pressure To check your blood pressure, follow the instructions in the manual that came with your blood pressure monitor. If you have a digital blood pressure monitor, the instructions may be as follows: 1. Sit up straight in a chair. 2. Place your feet on the floor. Do not cross your ankles or legs. 3. Rest your left arm at the level of your heart on a table or desk or on the arm of a chair. 4. Pull up your shirt sleeve. 5. Wrap the blood pressure cuff around the upper part of your left arm, 1 inch (2.5 cm) above your elbow. It is best to wrap the cuff around bare skin. 6. Fit the cuff snugly around your arm. You should be able to place only one finger between the cuff and your arm. 7. Position the cord so that it rests in the bend of your elbow. 8. Press the power button. 9. Sit quietly while the cuff inflates and deflates. 10. Read the digital reading on the monitor screen and write the numbers down (record them) in a notebook. 11. Wait 2-3 minutes, then repeat the steps, starting at step 1.   What does my blood pressure reading mean? A blood pressure reading consists of a higher number over a lower number. Ideally, your blood pressure should be below 120/80. The first ("top") number is called the systolic pressure. It is a measure of the pressure in your arteries as your heart beats. The second ("bottom") number is called the diastolic pressure. It is a measure of the pressure in your arteries as the heart relaxes. Blood pressure is classified into five stages. The following are the stages for adults who do not have a short-term serious illness or a chronic condition. Systolic pressure and diastolic pressure are measured in a unit called mm Hg (millimeters of mercury).  Normal  Systolic pressure:  below 120.  Diastolic pressure: below 80. Elevated  Systolic pressure: 119-417.  Diastolic pressure: below 80. Hypertension stage 1  Systolic pressure: 229-798.  Diastolic pressure: 92-11. Hypertension stage 2  Systolic pressure: 941 or above.  Diastolic pressure: 90 or above. You can have elevated blood pressure or hypertension even if only the systolic or only the diastolic number in your reading is higher than normal. Follow these instructions at home:  Check your blood pressure as often as recommended by your health care provider.  Check your blood pressure at the same time every day.  Take your monitor to the next appointment with your health care provider to make sure that: ? You are using it correctly. ? It provides accurate readings.  Be sure you understand what your goal blood pressure numbers are.  Tell your health care provider if you are having any side effects from blood pressure medicine.  Keep all follow-up visits as told by your health care provider. This is important. General tips  Your health care provider can suggest a reliable monitor that will meet your needs. There are several types of home blood pressure monitors.  Choose a monitor that has an arm cuff. Do not choose a monitor that measures your blood pressure from your wrist or finger.  Choose a cuff that wraps snugly around your upper arm. You should be able to fit only one finger between your arm and the cuff.  You can buy a blood pressure monitor at most drugstores or online. Where to find more information American Heart Association: www.heart.org Contact a health care provider if:  Your blood pressure is consistently high. Get help right away if:  Your systolic blood pressure is higher than 180.  Your diastolic blood pressure is higher than 120. Summary  Blood pressure is a measurement of how strongly your blood is pressing against the walls of your arteries.  A blood pressure  reading consists of a higher number over a lower number. Ideally, your blood pressure should be below 120/80.  Check your blood pressure at the same time every day.  Avoid caffeine, alcohol, smoking, and exercise for 30 minutes prior to checking your blood pressure. These agents can affect the accuracy of the blood pressure reading. This information is not intended to replace advice given to you by your health care provider. Make sure you discuss any questions you have with your health care provider. Document Revised: 10/13/2019 Document Reviewed: 10/13/2019 Elsevier Patient Education  2021 [place]. Lisinopril Tablets What is this medicine? LISINOPRIL (lyse [place] oh pril) is an ACE inhibitor. It treats high blood pressure and heart failure. It can treat heart damage after a heart attack. This medicine may be used for other purposes; ask your health care provider or pharmacist if you have questions. COMMON BRAND NAME(S): Prinivil, Zestril What should I tell my health care provider before I take this medicine? They need to know if you have any of these conditions:  diabetes  heart or blood vessel disease  kidney disease  low blood pressure  previous swelling of the tongue, face, or lips with difficulty breathing, difficulty swallowing, hoarseness, or tightening of the throat  an unusual or allergic reaction to lisinopril, other ACE inhibitors, insect venom, foods, dyes, or preservatives  pregnant or trying to get pregnant  breast-feeding How should I use this medicine? Take this medicine by mouth. Take it as directed on the prescription label at the same time every day. You can take it with or without food. If it upsets your stomach, take it with food. Keep taking it  unless your health care provider tells you to stop. Talk to your health care provider about the use of this medicine in children. While it may be prescribed for children as young as 6 for selected conditions,  precautions do apply. Overdosage: If you think you have taken too much of this medicine contact a poison control center or emergency room at once. NOTE: This medicine is only for you. Do not share this medicine with others. What if I miss a dose? If you miss a dose, take it as soon as you can. If it is almost time for your next dose, take only that dose. Do not take double or extra doses. What may interact with this medicine? Do not take this medicine with any of the following medications:  hymenoptera venom  sacubitril; valsartan This medicines may also interact with the following medications:  aliskiren  angiotensin receptor blockers, like losartan or valsartan  certain medicines for diabetes  diuretics  everolimus  gold compounds  lithium  NSAIDs, medicines for pain and inflammation, like ibuprofen or naproxen  potassium salts or supplements  salt substitutes  sirolimus  temsirolimus This list may not describe all possible interactions. Give your health care provider a list of all the medicines, herbs, non-prescription drugs, or dietary supplements you use. Also tell them if you smoke, drink alcohol, or use illegal drugs. Some items may interact with your medicine. What should I watch for while using this medicine? Visit your health care provider for regular check ups. Check your blood pressure as directed. Ask your health care provider what your blood pressure should be. Also, find out when you should contact him or her. Do not treat yourself for coughs, colds, or pain while you are using this medicine without asking your health care provider for advice. Some medicines may increase your blood pressure. Inform your health care provider if you wish to become pregnant or think you might be pregnant. There is a potential for serious side effects to an unborn child. Talk to your health care provider for more information. You may get drowsy or dizzy. Do not drive, use  machinery, or do anything that needs mental alertness until you know how this medicine affects you. Do not stand or sit up quickly, especially if you are an older patient. This reduces the risk of dizzy or fainting spells. Alcohol can make you more drowsy and dizzy. Avoid alcoholic drinks. Avoid salt substitutes unless you are told otherwise by your health care provider. What side effects may I notice from receiving this medicine? Side effects that you should report to your doctor or health care professional as soon as possible:  allergic reactions (skin rash, itching or hives, swelling of the hands, feet, face, lips, throat, or tongue)  breathing problems  high potassium levels (chest pain; or fast, irregular heartbeat; muscle weakness)  kidney injury (trouble passing urine or change in the amount of urine)  liver injury (dark yellow or brown urine; general ill feeling or flu-like symptoms; light-colored stools; loss of appetite; right upper belly pain; unusually weak or tired; yellowing of the eyes or skin)  low blood pressure (dizziness; feeling faint or lightheaded, falls; unusually weak or tired) Side effects that usually do not require medical attention (report to your doctor or health care professional if they continue or are bothersome):  changes in taste  cough  dizziness  headache This list may not describe all possible side effects. Call your doctor for medical advice about side effects. You may  report side effects to FDA at 1-800-FDA-1088. Where should I keep my medicine? Keep out of the reach of children and pets. Store at room temperature between 20 and 25 degrees C (68 and 77 degrees F). Protect from moisture. Keep the container tightly closed. Do not freeze. Avoid exposure to extreme heat. Get rid of any unused medicine after the expiration date. To get rid of medicines that are no longer needed or have expired:  Take the medicine to a medicine take-back program. Check  with your pharmacy or law enforcement to find a location.  If you cannot return the medicine, check the label or package insert to see if the medicine should be thrown out in the garbage or flushed down the toilet. If you are not sure, ask your health care provider. If it is safe to put in the trash, empty the medicine out of the container. Mix the medicine with cat litter, dirt, coffee grounds, or other unwanted substance. Seal the mixture in a bag or container. Put it in the trash. NOTE: This sheet is a summary. It may not cover all possible information. If you have questions about this medicine, talk to your doctor, pharmacist, or health care provider.  2021 Elsevier/Gold Standard (2020-09-14 14:57:12)

## 2021-04-07 LAB — COMPREHENSIVE METABOLIC PANEL
ALT: 21 U/L (ref 0–53)
AST: 18 U/L (ref 0–37)
Albumin: 4.6 g/dL (ref 3.5–5.2)
Alkaline Phosphatase: 66 U/L (ref 39–117)
BUN: 14 mg/dL (ref 6–23)
CO2: 25 mEq/L (ref 19–32)
Calcium: 9.9 mg/dL (ref 8.4–10.5)
Chloride: 100 mEq/L (ref 96–112)
Creatinine, Ser: 0.99 mg/dL (ref 0.40–1.50)
GFR: 83.73 mL/min (ref >60.00)
Glucose, Bld: 95 mg/dL (ref 70–99)
Potassium: 4.3 mEq/L (ref 3.5–5.1)
Sodium: 136 mEq/L (ref 135–145)
Total Bilirubin: 0.4 mg/dL (ref 0.2–1.2)
Total Protein: 6.8 g/dL (ref 6.0–8.3)

## 2021-04-07 LAB — LIPID PANEL
Cholesterol: 178 mg/dL (ref 0–200)
HDL: 56.8 mg/dL (ref >39.00)
LDL Cholesterol: 110 mg/dL — ABNORMAL HIGH (ref 0–99)
NonHDL: 121.56
Total CHOL/HDL Ratio: 3
Triglycerides: 60 mg/dL (ref 0.0–149.0)
VLDL: 12 mg/dL (ref 0.0–40.0)

## 2021-10-06 ENCOUNTER — Ambulatory Visit: Payer: Managed Care, Other (non HMO) | Admitting: Family Medicine

## 2021-10-06 ENCOUNTER — Other Ambulatory Visit: Payer: Self-pay

## 2021-10-07 ENCOUNTER — Other Ambulatory Visit: Payer: Self-pay | Admitting: Family Medicine

## 2021-10-07 DIAGNOSIS — I10 Essential (primary) hypertension: Secondary | ICD-10-CM

## 2021-10-13 ENCOUNTER — Other Ambulatory Visit: Payer: Self-pay

## 2021-10-13 ENCOUNTER — Ambulatory Visit: Payer: Managed Care, Other (non HMO) | Admitting: Family Medicine

## 2021-10-13 ENCOUNTER — Encounter: Payer: Self-pay | Admitting: Family Medicine

## 2021-10-13 VITALS — BP 160/74 | HR 93 | Temp 97.0°F | Ht 75.0 in | Wt 215.0 lb

## 2021-10-13 DIAGNOSIS — I10 Essential (primary) hypertension: Secondary | ICD-10-CM | POA: Diagnosis not present

## 2021-10-13 DIAGNOSIS — N5201 Erectile dysfunction due to arterial insufficiency: Secondary | ICD-10-CM | POA: Insufficient documentation

## 2021-10-13 DIAGNOSIS — R058 Other specified cough: Secondary | ICD-10-CM | POA: Insufficient documentation

## 2021-10-13 MED ORDER — SILDENAFIL CITRATE 20 MG PO TABS: ORAL_TABLET | ORAL | 1 refills | Status: DC

## 2021-10-13 MED ORDER — OLMESARTAN MEDOXOMIL 20 MG PO TABS: 20.0000 mg | ORAL_TABLET | Freq: Every day | ORAL | 1 refills | Status: DC

## 2021-10-13 MED ORDER — PREDNISONE 10 MG PO TABS: 10.0000 mg | ORAL_TABLET | Freq: Two times a day (BID) | ORAL | 0 refills | Status: AC

## 2021-10-13 MED ORDER — BENZONATATE 200 MG PO CAPS: 200.0000 mg | ORAL_CAPSULE | Freq: Two times a day (BID) | ORAL | 0 refills | Status: DC | PRN

## 2021-10-13 NOTE — Progress Notes (Signed)
Established Patient Office Visit  Subjective:  Patient ID: David Nash, male    DOB: 05-15-1961  Age: 60 y.o. MRN: [id number]  CC:  Chief Complaint  Patient presents with   Follow-up    Follow up on medications.     HPI David Nash presents for follow-up of hypertension, recent URI and ED.  Generic sildenafil has worked well in the past.  He requests a prescription.  Developed a nagging cough after starting lisinopril that resolved after he discontinued it.  Status post 1 week of URI signs and symptoms seem to be resolving except for a dry cough with nighttime wheezing.  No history of asthma.  He does not smoke.  Denies current fevers chills, facial pressure or teeth pain, postnasal drip, body aches or headaches.  Tested negative for COVID.  Past Medical History:  Diagnosis Date   Anxiety    Arthritis    Hypertension     No past surgical history on file.  Family History  Adopted: Yes  Family history unknown: Yes    Social History   Socioeconomic History   Marital status: Married    Spouse name: Not on file   Number of children: Not on file   Years of education: Not on file   Highest education level: Not on file  Occupational History   Not on file  Tobacco Use   Smoking status: Never   Smokeless tobacco: Never  Vaping Use   Vaping Use: Never used  Substance and Sexual Activity   Alcohol use: Yes    Alcohol/week: 1.0 standard drink    Types: 1 Glasses of wine per week   Drug use: No   Sexual activity: Yes  Other Topics Concern   Not on file  Social History Narrative   Not on file   Social Determinants of Health   Financial Resource Strain: Not on file  Food Insecurity: Not on file  Transportation Needs: Not on file  Physical Activity: Not on file  Stress: Not on file  Social Connections: Not on file  Intimate Partner Violence: Not on file    Outpatient Medications Prior to Visit  Medication Sig Dispense Refill   amLODipine (NORVASC) 10 MG tablet  TAKE ONE TABLET BY MOUTH DAILY 90 tablet 1   ascorbic acid (VITAMIN C) 100 MG tablet Take by mouth.     calcium-vitamin D 250-100 MG-UNIT tablet Take 1 tablet by mouth every morning.     EPINEPHrine (EPIPEN) 0.3 mg/0.3 mL SOAJ Inject 0.3 mLs (0.3 mg total) into the muscle as needed. 1 Device 1   Multiple Vitamins-Minerals (THERA-M) TABS Take 1 tablet by mouth daily.     Omega-3 Fatty Acids (FISH OIL) 1000 MG CAPS Take by mouth.     sildenafil (REVATIO) 20 MG tablet Take 3-5 45 minutes prior 50 tablet 1   lisinopril (ZESTRIL) 5 MG tablet Take 1 tablet (5 mg total) by mouth daily. (Patient not taking: Reported on 10/13/2021) 90 tablet 3   No facility-administered medications prior to visit.    Allergies  Allergen Reactions   Bee Venom    Sulfa Antibiotics    Lisinopril Cough    ROS Review of Systems  Constitutional:  Negative for diaphoresis, fatigue, fever and unexpected weight change.  HENT: Negative.  Negative for congestion, postnasal drip and rhinorrhea.   Eyes:  Negative for photophobia and visual disturbance.  Respiratory:  Positive for cough and wheezing. Negative for chest tightness.   Gastrointestinal: Negative.   Endocrine: Negative  for polyphagia and polyuria.  Genitourinary: Negative.   Musculoskeletal:  Negative for arthralgias and myalgias.  Skin:  Negative for rash.  Neurological:  Negative for headaches.     Objective:    Physical Exam Vitals and nursing note reviewed.  Constitutional:      General: He is not in acute distress.    Appearance: He is not ill-appearing, toxic-appearing or diaphoretic.  HENT:     Head: Normocephalic and atraumatic.     Right Ear: Tympanic membrane, ear canal and external ear normal.     Left Ear: Tympanic membrane and ear canal normal.     Mouth/Throat:     Mouth: Mucous membranes are moist.     Pharynx: Oropharynx is clear. No oropharyngeal exudate or posterior oropharyngeal erythema.  Eyes:     General: No scleral icterus.        Right eye: No discharge.        Left eye: No discharge.     Extraocular Movements: Extraocular movements intact.     Conjunctiva/sclera: Conjunctivae normal.     Pupils: Pupils are equal, round, and reactive to light.  Neck:     Vascular: No carotid bruit.  Cardiovascular:     Rate and Rhythm: Normal rate and regular rhythm.  Pulmonary:     Effort: Pulmonary effort is normal. No respiratory distress.     Breath sounds: Normal breath sounds. No wheezing or rales.  Abdominal:     General: Abdomen is flat. Bowel sounds are normal. There is no distension.     Palpations: Abdomen is soft. There is no mass.     Tenderness: There is no abdominal tenderness. There is no guarding or rebound.     Hernia: No hernia is present.  Musculoskeletal:     Cervical back: No rigidity or tenderness.     Right lower leg: No edema.     Left lower leg: No edema.  Lymphadenopathy:     Cervical: No cervical adenopathy.  Skin:    General: Skin is warm and dry.     Findings: No rash.  Neurological:     Mental Status: He is alert and oriented to person, place, and time.  Psychiatric:        Mood and Affect: Mood normal.        Behavior: Behavior normal.    BP (!) 160/74 (BP Location: Left Arm, Patient Position: Sitting, Cuff Size: Large)   Pulse 93   Temp (!) 97 F (36.1 C) (Temporal)   Ht 6\' 3"  (1.905 m)   Wt 215 lb (97.5 kg)   SpO2 98%   BMI 26.87 kg/m  Wt Readings from Last 3 Encounters:  10/13/21 215 lb (97.5 kg)  04/04/21 213 lb 6.4 oz (96.8 kg)  03/21/19 230 lb (104.3 kg)     Health Maintenance Due  Topic Date Due   HIV Screening  Never done   Hepatitis C Screening  Never done    There are no preventive care reminders to display for this patient.  No results found for: TSH Lab Results  Component Value Date   WBC 4.1 04/04/2021   HGB 14.2 04/04/2021   HCT 41.8 04/04/2021   MCV 96.1 04/04/2021   PLT 258.0 04/04/2021   Lab Results  Component Value Date   NA 136  04/04/2021   K 4.3 04/04/2021   CO2 25 04/04/2021   GLUCOSE 95 04/04/2021   BUN 14 04/04/2021   CREATININE 0.99 04/04/2021   BILITOT 0.4 04/04/2021  ALKPHOS 66 04/04/2021   AST 18 04/04/2021   ALT 21 04/04/2021   PROT 6.8 04/04/2021   ALBUMIN 4.6 04/04/2021   CALCIUM 9.9 04/04/2021   GFR 83.73 04/04/2021   Lab Results  Component Value Date   CHOL 178 04/04/2021   Lab Results  Component Value Date   HDL 56.80 04/04/2021   Lab Results  Component Value Date   LDLCALC 110 (H) 04/04/2021   Lab Results  Component Value Date   TRIG 60.0 04/04/2021   Lab Results  Component Value Date   CHOLHDL 3 04/04/2021   No results found for: HGBA1C    Assessment & Plan:   Problem List Items Addressed This Visit       Cardiovascular and Mediastinum   Essential hypertension - Primary   Relevant Medications   sildenafil (REVATIO) 20 MG tablet   olmesartan (BENICAR) 20 MG tablet   Erectile dysfunction due to arterial insufficiency   Relevant Medications   sildenafil (REVATIO) 20 MG tablet   olmesartan (BENICAR) 20 MG tablet     Respiratory   Post-viral cough syndrome   Relevant Medications   predniSONE (DELTASONE) 10 MG tablet   benzonatate (TESSALON) 200 MG capsule    Meds ordered this encounter  Medications   sildenafil (REVATIO) 20 MG tablet    Sig: Take 3-5 45 minutes prior    Dispense:  50 tablet    Refill:  1   olmesartan (BENICAR) 20 MG tablet    Sig: Take 1 tablet (20 mg total) by mouth daily.    Dispense:  90 tablet    Refill:  1   predniSONE (DELTASONE) 10 MG tablet    Sig: Take 1 tablet (10 mg total) by mouth 2 (two) times daily with a meal for 7 days.    Dispense:  14 tablet    Refill:  0   benzonatate (TESSALON) 200 MG capsule    Sig: Take 1 capsule (200 mg total) by mouth 2 (two) times daily as needed for cough.    Dispense:  20 capsule    Refill:  0    Follow-up: Return in about 3 months (around 01/11/2022), or if symptoms worsen or fail to  improve.    Libby Maw, MD

## 2021-12-09 ENCOUNTER — Other Ambulatory Visit: Payer: Self-pay

## 2021-12-10 ENCOUNTER — Encounter: Payer: Self-pay | Admitting: Family Medicine

## 2021-12-10 ENCOUNTER — Ambulatory Visit: Payer: Managed Care, Other (non HMO) | Admitting: Family Medicine

## 2021-12-10 VITALS — BP 146/72 | HR 99 | Temp 97.1°F | Ht 75.0 in | Wt 220.8 lb

## 2021-12-10 DIAGNOSIS — D229 Melanocytic nevi, unspecified: Secondary | ICD-10-CM | POA: Diagnosis not present

## 2021-12-10 DIAGNOSIS — K649 Unspecified hemorrhoids: Secondary | ICD-10-CM

## 2021-12-10 NOTE — Progress Notes (Signed)
For  Established Patient Office Visit  Subjective:  Patient ID: David Nash, male    DOB: 03-27-1961  Age: 61 y.o. MRN: [id number]  CC:  Chief Complaint  Patient presents with   Hemorrhoids    Check hemorrhoids burst 2 days ago would like this checked, referral to dermatologist for moles on back and possible general surgery for hemorrhoids.     HPI David Nash presents for evaluation and treatment of a tender swelling in his anus that recently ruptured and bled.  Has occasional difficulty with constipation.  Denies long toilet sitting.  Recent colonoscopy.  Denies ongoing hematochezia or melena.  Is concerned about moles about his trunk.  Past Medical History:  Diagnosis Date   Anxiety    Arthritis    Hypertension     No past surgical history on file.  Family History  Adopted: Yes  Family history unknown: Yes    Social History   Socioeconomic History   Marital status: Married    Spouse name: Not on file   Number of children: Not on file   Years of education: Not on file   Highest education level: Not on file  Occupational History   Not on file  Tobacco Use   Smoking status: Never   Smokeless tobacco: Never  Vaping Use   Vaping Use: Never used  Substance and Sexual Activity   Alcohol use: Yes    Alcohol/week: 1.0 standard drink    Types: 1 Glasses of wine per week   Drug use: No   Sexual activity: Yes  Other Topics Concern   Not on file  Social History Narrative   Not on file   Social Determinants of Health   Financial Resource Strain: Not on file  Food Insecurity: Not on file  Transportation Needs: Not on file  Physical Activity: Not on file  Stress: Not on file  Social Connections: Not on file  Intimate Partner Violence: Not on file    Outpatient Medications Prior to Visit  Medication Sig Dispense Refill   amLODipine (NORVASC) 10 MG tablet TAKE ONE TABLET BY MOUTH DAILY 90 tablet 1   ascorbic acid (VITAMIN C) 100 MG tablet Take by mouth.      calcium-vitamin D 250-100 MG-UNIT tablet Take 1 tablet by mouth every morning.     EPINEPHrine (EPIPEN) 0.3 mg/0.3 mL SOAJ Inject 0.3 mLs (0.3 mg total) into the muscle as needed. 1 Device 1   Multiple Vitamins-Minerals (THERA-M) TABS Take 1 tablet by mouth daily.     olmesartan (BENICAR) 20 MG tablet Take 1 tablet (20 mg total) by mouth daily. 90 tablet 1   Omega-3 Fatty Acids (FISH OIL) 1000 MG CAPS Take by mouth.     sildenafil (REVATIO) 20 MG tablet Take 3-5 45 minutes prior 50 tablet 1   benzonatate (TESSALON) 200 MG capsule Take 1 capsule (200 mg total) by mouth 2 (two) times daily as needed for cough. 20 capsule 0   No facility-administered medications prior to visit.    Allergies  Allergen Reactions   Bee Venom    Sulfa Antibiotics    Lisinopril Cough    ROS Review of Systems  Constitutional:  Negative for diaphoresis, fatigue, fever and unexpected weight change.  HENT: Negative.    Eyes:  Negative for photophobia and visual disturbance.  Respiratory: Negative.    Cardiovascular: Negative.   Gastrointestinal:  Positive for anal bleeding and constipation. Negative for abdominal pain, blood in stool, diarrhea, nausea and vomiting.  Genitourinary:  Negative.   Neurological:  Negative for speech difficulty and weakness.     Objective:    Physical Exam Vitals and nursing note reviewed.  Constitutional:      General: He is not in acute distress.    Appearance: Normal appearance. He is not ill-appearing, toxic-appearing or diaphoretic.  HENT:     Head: Normocephalic and atraumatic.     Right Ear: External ear normal.     Left Ear: External ear normal.  Eyes:     General: No scleral icterus.       Right eye: No discharge.        Left eye: No discharge.     Extraocular Movements: Extraocular movements intact.     Conjunctiva/sclera: Conjunctivae normal.  Pulmonary:     Effort: Pulmonary effort is normal.  Abdominal:     General: Bowel sounds are normal. There is no  distension.     Palpations: There is no mass.     Tenderness: There is no abdominal tenderness. There is no guarding or rebound.     Hernia: No hernia is present.  Genitourinary:   Skin:    General: Skin is warm and dry.       Neurological:     Mental Status: He is alert and oriented to person, place, and time.  Psychiatric:        Mood and Affect: Mood normal.        Behavior: Behavior normal.    BP (!) 146/72 (BP Location: Left Arm, Patient Position: Sitting, Cuff Size: Normal)    Pulse 99    Temp (!) 97.1 F (36.2 C) (Temporal)    Ht 6\' 3"  (1.905 m)    Wt 220 lb 12.8 oz (100.2 kg)    SpO2 99%    BMI 27.60 kg/m  Wt Readings from Last 3 Encounters:  12/10/21 220 lb 12.8 oz (100.2 kg)  10/13/21 215 lb (97.5 kg)  04/04/21 213 lb 6.4 oz (96.8 kg)     Health Maintenance Due  Topic Date Due   HIV Screening  Never done   Hepatitis C Screening  Never done   OPHTHALMOLOGY EXAM  10/07/2021    There are no preventive care reminders to display for this patient.  No results found for: TSH Lab Results  Component Value Date   WBC 4.1 04/04/2021   HGB 14.2 04/04/2021   HCT 41.8 04/04/2021   MCV 96.1 04/04/2021   PLT 258.0 04/04/2021   Lab Results  Component Value Date   NA 136 04/04/2021   K 4.3 04/04/2021   CO2 25 04/04/2021   GLUCOSE 95 04/04/2021   BUN 14 04/04/2021   CREATININE 0.99 04/04/2021   BILITOT 0.4 04/04/2021   ALKPHOS 66 04/04/2021   AST 18 04/04/2021   ALT 21 04/04/2021   PROT 6.8 04/04/2021   ALBUMIN 4.6 04/04/2021   CALCIUM 9.9 04/04/2021   GFR 83.73 04/04/2021   Lab Results  Component Value Date   CHOL 178 04/04/2021   Lab Results  Component Value Date   HDL 56.80 04/04/2021   Lab Results  Component Value Date   LDLCALC 110 (H) 04/04/2021   Lab Results  Component Value Date   TRIG 60.0 04/04/2021   Lab Results  Component Value Date   CHOLHDL 3 04/04/2021   No results found for: HGBA1C    Assessment & Plan:   Problem List  Items Addressed This Visit       Cardiovascular and Mediastinum   Hemorrhoids  Relevant Orders   Ambulatory referral to General Surgery     Other   Atypical mole - Primary   Relevant Orders   Ambulatory referral to Dermatology    No orders of the defined types were placed in this encounter.   Follow-up: Return if symptoms worsen or fail to improve.  Continue with stool softener, Anusol cream, cleaning with Tucks pads and sitz bath's.  Surgical referral for evaluation and hemorrhoidectomy.  Dermatology referral for mole check.  Libby Maw, MD

## 2021-12-11 ENCOUNTER — Encounter: Payer: Self-pay | Admitting: Family Medicine

## 2021-12-15 ENCOUNTER — Telehealth: Payer: Self-pay | Admitting: Dermatology

## 2021-12-15 NOTE — Telephone Encounter (Signed)
Please contact referrer and see if they can get him in elsewhere before September.

## 2021-12-16 NOTE — Telephone Encounter (Signed)
Notes documented and referral routed back to referring office. 

## 2022-04-07 ENCOUNTER — Encounter: Payer: Managed Care, Other (non HMO) | Admitting: Family Medicine

## 2022-04-11 ENCOUNTER — Other Ambulatory Visit: Payer: Self-pay | Admitting: Family Medicine

## 2022-04-11 DIAGNOSIS — I10 Essential (primary) hypertension: Secondary | ICD-10-CM

## 2022-06-04 ENCOUNTER — Encounter: Payer: Managed Care, Other (non HMO) | Admitting: Family Medicine

## 2022-06-11 ENCOUNTER — Ambulatory Visit (INDEPENDENT_AMBULATORY_CARE_PROVIDER_SITE_OTHER): Payer: BC Managed Care – PPO | Admitting: Family Medicine

## 2022-06-11 ENCOUNTER — Encounter: Payer: Self-pay | Admitting: Family Medicine

## 2022-06-11 VITALS — BP 124/70 | HR 89 | Temp 97.0°F | Ht 75.0 in | Wt 212.2 lb

## 2022-06-11 DIAGNOSIS — Z0000 Encounter for general adult medical examination without abnormal findings: Secondary | ICD-10-CM

## 2022-06-11 DIAGNOSIS — N5201 Erectile dysfunction due to arterial insufficiency: Secondary | ICD-10-CM

## 2022-06-11 DIAGNOSIS — I10 Essential (primary) hypertension: Secondary | ICD-10-CM

## 2022-06-11 DIAGNOSIS — R6882 Decreased libido: Secondary | ICD-10-CM

## 2022-06-11 LAB — COMPREHENSIVE METABOLIC PANEL
ALT: 28 U/L (ref 0–53)
AST: 20 U/L (ref 0–37)
Albumin: 4.8 g/dL (ref 3.5–5.2)
Alkaline Phosphatase: 67 U/L (ref 39–117)
BUN: 10 mg/dL (ref 6–23)
CO2: 28 mEq/L (ref 19–32)
Calcium: 10.2 mg/dL (ref 8.4–10.5)
Chloride: 100 mEq/L (ref 96–112)
Creatinine, Ser: 0.99 mg/dL (ref 0.40–1.50)
GFR: 83.04 mL/min (ref >60.00)
Glucose, Bld: 106 mg/dL — ABNORMAL HIGH (ref 70–99)
Potassium: 4.3 mEq/L (ref 3.5–5.1)
Sodium: 137 mEq/L (ref 135–145)
Total Bilirubin: 0.9 mg/dL (ref 0.2–1.2)
Total Protein: 7.2 g/dL (ref 6.0–8.3)

## 2022-06-11 LAB — CBC WITH DIFFERENTIAL/PLATELET
Basophils Absolute: 0 10*3/uL (ref 0.0–0.1)
Basophils Relative: 0.4 % (ref 0.0–3.0)
Eosinophils Absolute: 0.3 10*3/uL (ref 0.0–0.7)
Eosinophils Relative: 6 % — ABNORMAL HIGH (ref 0.0–5.0)
HCT: 42.5 % (ref 39.0–52.0)
Hemoglobin: 14.4 g/dL (ref 13.0–17.0)
Lymphocytes Relative: 22.8 % (ref 12.0–46.0)
Lymphs Abs: 1 10*3/uL (ref 0.7–4.0)
MCHC: 33.8 g/dL (ref 30.0–36.0)
MCV: 100 fl (ref 78.0–100.0)
Monocytes Absolute: 0.5 10*3/uL (ref 0.1–1.0)
Monocytes Relative: 11 % (ref 3.0–12.0)
Neutro Abs: 2.7 10*3/uL (ref 1.4–7.7)
Neutrophils Relative %: 59.8 % (ref 43.0–77.0)
Platelets: 247 10*3/uL (ref 150.0–400.0)
RBC: 4.25 Mil/uL (ref 4.22–5.81)
RDW: 12.5 % (ref 11.5–15.5)
WBC: 4.4 10*3/uL (ref 4.0–10.5)

## 2022-06-11 LAB — LIPID PANEL
Cholesterol: 181 mg/dL (ref 0–200)
HDL: 54 mg/dL (ref >39.00)
LDL Cholesterol: 103 mg/dL — ABNORMAL HIGH (ref 0–99)
NonHDL: 126.82
Total CHOL/HDL Ratio: 3
Triglycerides: 120 mg/dL (ref 0.0–149.0)
VLDL: 24 mg/dL (ref 0.0–40.0)

## 2022-06-11 LAB — PSA: PSA: 2.28 ng/mL (ref 0.10–4.00)

## 2022-06-11 LAB — HEMOGLOBIN A1C: Hgb A1c MFr Bld: 5.5 % (ref 4.6–6.5)

## 2022-06-11 MED ORDER — AMLODIPINE BESYLATE 10 MG PO TABS: 10.0000 mg | ORAL_TABLET | Freq: Every day | ORAL | 3 refills | Status: DC

## 2022-06-11 MED ORDER — OLMESARTAN MEDOXOMIL 20 MG PO TABS: 20.0000 mg | ORAL_TABLET | Freq: Every day | ORAL | 3 refills | Status: DC

## 2022-06-11 MED ORDER — SILDENAFIL CITRATE 20 MG PO TABS: ORAL_TABLET | ORAL | 1 refills | Status: DC

## 2022-06-11 NOTE — Progress Notes (Signed)
Established Patient Office Visit  Subjective   Patient ID: David Nash, male    DOB: November 01, 1961  Age: 61 y.o. MRN: [id number]  Chief Complaint  Patient presents with   Annual Exam    CPE, no concerns. Patient fasting.     HPI here for follow-up on hypertension, ED and a physical exam.  He is stressed and rushed.  He is working 2 jobs because his wife is currently out of work right now.  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  He is quite active on both of his jobs and does not have time for exercise at this point.  He is up-to-date on health maintenance.  He has noticed a decrease in his libido.  Denies issues with urine flow or stooling.    Review of Systems  Constitutional: Negative.   HENT: Negative.    Eyes:  Negative for blurred vision, discharge and redness.  Respiratory: Negative.    Cardiovascular: Negative.   Gastrointestinal:  Negative for abdominal pain, blood in stool, constipation and melena.  Genitourinary: Negative.  Negative for dysuria, frequency and urgency.  Musculoskeletal: Negative.  Negative for myalgias.  Skin:  Negative for rash.  Neurological:  Negative for tingling, loss of consciousness and weakness.  Endo/Heme/Allergies:  Negative for polydipsia.      Objective:     BP 124/70 (BP Location: Left Arm, Patient Position: Sitting, Cuff Size: Large)   Pulse 89   Temp (!) 97 F (36.1 C) (Temporal)   Ht '6\' 3"'$  (1.905 m)   Wt 212 lb 3.2 oz (96.3 kg)   SpO2 98%   BMI 26.52 kg/m    Physical Exam Constitutional:      General: He is not in acute distress.    Appearance: Normal appearance. He is not ill-appearing, toxic-appearing or diaphoretic.  HENT:     Head: Normocephalic and atraumatic.     Right Ear: Tympanic membrane, ear canal and external ear normal.     Left Ear: Tympanic membrane, ear canal and external ear normal.     Mouth/Throat:     Mouth: Mucous membranes are moist.     Pharynx: Oropharynx is clear. No oropharyngeal exudate  or posterior oropharyngeal erythema.  Eyes:     General: No scleral icterus.       Right eye: No discharge.        Left eye: No discharge.     Extraocular Movements: Extraocular movements intact.     Conjunctiva/sclera: Conjunctivae normal.     Pupils: Pupils are equal, round, and reactive to light.  Cardiovascular:     Rate and Rhythm: Normal rate and regular rhythm.  Pulmonary:     Effort: Pulmonary effort is normal. No respiratory distress.     Breath sounds: Normal breath sounds.  Abdominal:     General: Bowel sounds are normal.     Tenderness: There is no guarding.  Musculoskeletal:     Cervical back: No rigidity or tenderness.  Skin:    General: Skin is warm and dry.  Neurological:     Mental Status: He is alert and oriented to person, place, and time.  Psychiatric:        Mood and Affect: Mood normal.        Behavior: Behavior normal.      No results found for any visits on 06/11/22.    The 10-year ASCVD risk score (Arnett DK, et al., 2019) is: 7.8%    Assessment & Plan:   Problem  List Items Addressed This Visit       Cardiovascular and Mediastinum   Essential hypertension   Relevant Medications   amLODipine (NORVASC) 10 MG tablet   sildenafil (REVATIO) 20 MG tablet   olmesartan (BENICAR) 20 MG tablet   Other Relevant Orders   CBC with Differential/Platelet   Comprehensive metabolic panel   Urinalysis   Erectile dysfunction due to arterial insufficiency   Relevant Medications   amLODipine (NORVASC) 10 MG tablet   sildenafil (REVATIO) 20 MG tablet   olmesartan (BENICAR) 20 MG tablet   Other Visit Diagnoses     Healthcare maintenance    -  Primary   Relevant Orders   Hemoglobin A1c   Lipid panel   PSA   Libido, decreased       Relevant Orders   Testosterone Total,Free,Bio, Males-([facility])       Return in about 6 months (around 12/12/2022).  Briefly discussed testosterone replacement if indicated.  We will set up a virtual to discuss  needed.  Libby Maw, MD

## 2022-06-12 LAB — TESTOSTERONE TOTAL,FREE,BIO, MALES
Albumin: 4.6 g/dL (ref 3.6–5.1)
Sex Hormone Binding: 44 nmol/L (ref 22–77)
Testosterone, Bioavailable: 114 ng/dL (ref 110.0–575.0)
Testosterone, Free: 54.3 pg/mL (ref 46.0–224.0)
Testosterone: 516 ng/dL (ref 250–827)

## 2022-11-25 DIAGNOSIS — R519 Headache, unspecified: Secondary | ICD-10-CM | POA: Diagnosis not present

## 2022-11-25 DIAGNOSIS — R051 Acute cough: Secondary | ICD-10-CM | POA: Diagnosis not present

## 2022-11-25 DIAGNOSIS — M791 Myalgia, unspecified site: Secondary | ICD-10-CM | POA: Diagnosis not present

## 2022-11-25 DIAGNOSIS — R509 Fever, unspecified: Secondary | ICD-10-CM | POA: Diagnosis not present

## 2023-01-11 ENCOUNTER — Ambulatory Visit (INDEPENDENT_AMBULATORY_CARE_PROVIDER_SITE_OTHER): Payer: BC Managed Care – PPO | Admitting: Family Medicine

## 2023-01-11 ENCOUNTER — Encounter: Payer: Self-pay | Admitting: Family Medicine

## 2023-01-11 VITALS — HR 106 | Temp 97.6°F | Ht 75.0 in | Wt 232.1 lb

## 2023-01-11 DIAGNOSIS — R635 Abnormal weight gain: Secondary | ICD-10-CM

## 2023-01-11 DIAGNOSIS — R0683 Snoring: Secondary | ICD-10-CM

## 2023-01-11 DIAGNOSIS — N5201 Erectile dysfunction due to arterial insufficiency: Secondary | ICD-10-CM | POA: Diagnosis not present

## 2023-01-11 DIAGNOSIS — Z87898 Personal history of other specified conditions: Secondary | ICD-10-CM | POA: Diagnosis not present

## 2023-01-11 DIAGNOSIS — I10 Essential (primary) hypertension: Secondary | ICD-10-CM | POA: Diagnosis not present

## 2023-01-11 LAB — BASIC METABOLIC PANEL
BUN: 19 mg/dL (ref 6–23)
CO2: 27 mEq/L (ref 19–32)
Calcium: 10 mg/dL (ref 8.4–10.5)
Chloride: 99 mEq/L (ref 96–112)
Creatinine, Ser: 1.03 mg/dL (ref 0.40–1.50)
GFR: 78.86 mL/min (ref >60.00)
Glucose, Bld: 106 mg/dL — ABNORMAL HIGH (ref 70–99)
Potassium: 4.3 mEq/L (ref 3.5–5.1)
Sodium: 135 mEq/L (ref 135–145)

## 2023-01-11 LAB — TSH: TSH: 0.89 u[IU]/mL (ref 0.35–5.50)

## 2023-01-11 MED ORDER — OLMESARTAN MEDOXOMIL 20 MG PO TABS
20.0000 mg | ORAL_TABLET | Freq: Every day | ORAL | 3 refills | Status: AC
Start: 2023-01-11 — End: ?

## 2023-01-11 MED ORDER — SILDENAFIL CITRATE 20 MG PO TABS
ORAL_TABLET | ORAL | 1 refills | Status: AC
Start: 2023-01-11 — End: ?

## 2023-01-11 MED ORDER — AMLODIPINE BESYLATE 10 MG PO TABS: 10.0000 mg | ORAL_TABLET | Freq: Every day | ORAL | 3 refills | Status: AC

## 2023-01-11 NOTE — Progress Notes (Signed)
Established Patient Office Visit   Subjective:  Patient ID: David Nash, male    DOB: [birth year]  Age: 62 y.o. MRN: [id number]  Chief Complaint  Patient presents with   Medication Refill    Medication Refill Pertinent negatives include no abdominal pain, chest pain, myalgias, rash or weakness.   Encounter Diagnoses  Name Primary?   History of tachycardia Yes   Essential hypertension    Erectile dysfunction due to arterial insufficiency    Weight gain    Snores    Follow-up hypertension and ED. blood pressure well-controlled with olmesartan and amlodipine pain.  Continues as needed sildenafil with good result.  Ongoing history of pulse rate.  He denies palpitations, skipped or drop beats, shortness of breath or chest pain.  Not active physically.  Gaining weight.  Snoring has increased.   Review of Systems  Constitutional: Negative.   HENT: Negative.    Eyes:  Negative for blurred vision, discharge and redness.  Respiratory: Negative.  Negative for shortness of breath.   Cardiovascular: Negative.  Negative for chest pain and palpitations.  Gastrointestinal:  Negative for abdominal pain.  Genitourinary: Negative.   Musculoskeletal: Negative.  Negative for myalgias.  Skin:  Negative for rash.  Neurological:  Negative for tingling, loss of consciousness and weakness.  Endo/Heme/Allergies:  Negative for polydipsia.     Current Outpatient Medications:    ascorbic acid (VITAMIN C) 100 MG tablet, Take by mouth., Disp: , Rfl:    calcium-vitamin D 250-100 MG-UNIT tablet, Take 1 tablet by mouth every morning., Disp: , Rfl:    EPINEPHrine (EPIPEN) 0.3 mg/0.3 mL SOAJ, Inject 0.3 mLs (0.3 mg total) into the muscle as needed., Disp: 1 Device, Rfl: 1   Multiple Vitamins-Minerals (THERA-M) TABS, Take 1 tablet by mouth daily., Disp: , Rfl:    Omega-3 Fatty Acids (FISH OIL) 1000 MG CAPS, Take by mouth., Disp: , Rfl:    amLODipine (NORVASC) 10 MG tablet, Take 1 tablet (10 mg total) by  mouth daily., Disp: 90 tablet, Rfl: 3   olmesartan (BENICAR) 20 MG tablet, Take 1 tablet (20 mg total) by mouth daily., Disp: 90 tablet, Rfl: 3   sildenafil (REVATIO) 20 MG tablet, Take 3-5 45 minutes prior, Disp: 50 tablet, Rfl: 1   Objective:     Pulse (!) 106   Temp 97.6 F (36.4 C) (Oral)   Ht '6\' 3"'$  (1.905 m)   Wt 232 lb 2 oz (105.3 kg)   SpO2 99%   BMI 29.01 kg/m  BP Readings from Last 3 Encounters:  06/11/22 124/70  12/10/21 (!) 146/72  10/13/21 (!) 160/74   Wt Readings from Last 3 Encounters:  01/11/23 232 lb 2 oz (105.3 kg)  06/11/22 212 lb 3.2 oz (96.3 kg)  12/10/21 220 lb 12.8 oz (100.2 kg)      Physical Exam Constitutional:      General: He is not in acute distress.    Appearance: Normal appearance. He is not ill-appearing, toxic-appearing or diaphoretic.  HENT:     Head: Normocephalic and atraumatic.     Right Ear: External ear normal.     Left Ear: External ear normal.     Mouth/Throat:     Mouth: Mucous membranes are moist.     Pharynx: Oropharynx is clear. No oropharyngeal exudate or posterior oropharyngeal erythema.   Eyes:     General: No scleral icterus.       Right eye: No discharge.        Left eye: No  discharge.     Extraocular Movements: Extraocular movements intact.     Conjunctiva/sclera: Conjunctivae normal.     Pupils: Pupils are equal, round, and reactive to light.  Cardiovascular:     Rate and Rhythm: Regular rhythm. Tachycardia present.  Pulmonary:     Effort: Pulmonary effort is normal. No respiratory distress.     Breath sounds: Normal breath sounds. No wheezing, rhonchi or rales.  Abdominal:     General: Bowel sounds are normal.  Musculoskeletal:     Cervical back: No rigidity or tenderness.  Lymphadenopathy:     Cervical: No cervical adenopathy.  Skin:    General: Skin is warm and dry.  Neurological:     Mental Status: He is alert and oriented to person, place, and time.  Psychiatric:        Mood and Affect: Mood normal.         Behavior: Behavior normal.      No results found for any visits on 01/11/23.    The 10-year ASCVD risk score (Arnett DK, et al., 2019) is: 8.2%    Assessment & Plan:   History of tachycardia -     Basic metabolic panel -     TSH -     T3  Essential hypertension -     amLODIPine Besylate; Take 1 tablet (10 mg total) by mouth daily.  Dispense: 90 tablet; Refill: 3 -     Olmesartan Medoxomil; Take 1 tablet (20 mg total) by mouth daily.  Dispense: 90 tablet; Refill: 3 -     Basic metabolic panel  Erectile dysfunction due to arterial insufficiency -     Sildenafil Citrate; Take 3-5 45 minutes prior  Dispense: 50 tablet; Refill: 1  Weight gain  Snores -     Ambulatory referral to Pulmonology    Return Follow-up in August for physical or sooner if needed.Libby Maw, MD

## 2023-01-12 LAB — T3: T3, Total: 109 ng/dL (ref 76–181)

## 2023-02-01 DIAGNOSIS — M545 Low back pain, unspecified: Secondary | ICD-10-CM | POA: Diagnosis not present

## 2023-02-01 DIAGNOSIS — M5431 Sciatica, right side: Secondary | ICD-10-CM | POA: Diagnosis not present

## 2023-03-17 DIAGNOSIS — R0981 Nasal congestion: Secondary | ICD-10-CM | POA: Diagnosis not present

## 2023-03-17 DIAGNOSIS — J019 Acute sinusitis, unspecified: Secondary | ICD-10-CM | POA: Diagnosis not present

## 2023-06-01 DIAGNOSIS — R059 Cough, unspecified: Secondary | ICD-10-CM | POA: Diagnosis not present

## 2023-06-01 DIAGNOSIS — R5383 Other fatigue: Secondary | ICD-10-CM | POA: Diagnosis not present

## 2023-06-01 DIAGNOSIS — R519 Headache, unspecified: Secondary | ICD-10-CM | POA: Diagnosis not present

## 2023-06-01 DIAGNOSIS — R0981 Nasal congestion: Secondary | ICD-10-CM | POA: Diagnosis not present

## 2023-07-31 DIAGNOSIS — S52502A Unspecified fracture of the lower end of left radius, initial encounter for closed fracture: Secondary | ICD-10-CM | POA: Diagnosis not present

## 2023-07-31 DIAGNOSIS — M25532 Pain in left wrist: Secondary | ICD-10-CM | POA: Diagnosis not present

## 2023-07-31 DIAGNOSIS — M79642 Pain in left hand: Secondary | ICD-10-CM | POA: Diagnosis not present

## 2023-08-03 DIAGNOSIS — M25532 Pain in left wrist: Secondary | ICD-10-CM | POA: Diagnosis not present

## 2023-08-04 DIAGNOSIS — R200 Anesthesia of skin: Secondary | ICD-10-CM | POA: Diagnosis not present

## 2023-08-04 DIAGNOSIS — S52572A Other intraarticular fracture of lower end of left radius, initial encounter for closed fracture: Secondary | ICD-10-CM | POA: Diagnosis not present

## 2023-08-04 DIAGNOSIS — Y999 Unspecified external cause status: Secondary | ICD-10-CM | POA: Diagnosis not present

## 2023-08-04 DIAGNOSIS — G5602 Carpal tunnel syndrome, left upper limb: Secondary | ICD-10-CM | POA: Diagnosis not present

## 2023-08-04 DIAGNOSIS — G8918 Other acute postprocedural pain: Secondary | ICD-10-CM | POA: Diagnosis not present

## 2023-08-04 DIAGNOSIS — X58XXXA Exposure to other specified factors, initial encounter: Secondary | ICD-10-CM | POA: Diagnosis not present

## 2023-08-12 DIAGNOSIS — M25532 Pain in left wrist: Secondary | ICD-10-CM | POA: Diagnosis not present

## 2023-08-24 DIAGNOSIS — G5602 Carpal tunnel syndrome, left upper limb: Secondary | ICD-10-CM | POA: Diagnosis not present

## 2023-09-02 DIAGNOSIS — S52502S Unspecified fracture of the lower end of left radius, sequela: Secondary | ICD-10-CM | POA: Diagnosis not present

## 2023-09-02 DIAGNOSIS — M25632 Stiffness of left wrist, not elsewhere classified: Secondary | ICD-10-CM | POA: Diagnosis not present

## 2023-09-07 DIAGNOSIS — M25632 Stiffness of left wrist, not elsewhere classified: Secondary | ICD-10-CM | POA: Diagnosis not present

## 2023-09-07 DIAGNOSIS — S52502S Unspecified fracture of the lower end of left radius, sequela: Secondary | ICD-10-CM | POA: Diagnosis not present

## 2023-09-17 DIAGNOSIS — M25632 Stiffness of left wrist, not elsewhere classified: Secondary | ICD-10-CM | POA: Diagnosis not present

## 2023-09-17 DIAGNOSIS — S52502S Unspecified fracture of the lower end of left radius, sequela: Secondary | ICD-10-CM | POA: Diagnosis not present

## 2023-09-21 DIAGNOSIS — M25632 Stiffness of left wrist, not elsewhere classified: Secondary | ICD-10-CM | POA: Diagnosis not present

## 2023-09-29 DIAGNOSIS — S52502S Unspecified fracture of the lower end of left radius, sequela: Secondary | ICD-10-CM | POA: Diagnosis not present

## 2023-09-29 DIAGNOSIS — M25632 Stiffness of left wrist, not elsewhere classified: Secondary | ICD-10-CM | POA: Diagnosis not present

## 2023-10-15 DIAGNOSIS — S52502S Unspecified fracture of the lower end of left radius, sequela: Secondary | ICD-10-CM | POA: Diagnosis not present

## 2023-10-15 DIAGNOSIS — M25632 Stiffness of left wrist, not elsewhere classified: Secondary | ICD-10-CM | POA: Diagnosis not present

## 2023-11-11 DIAGNOSIS — M25632 Stiffness of left wrist, not elsewhere classified: Secondary | ICD-10-CM | POA: Diagnosis not present

## 2023-11-25 DIAGNOSIS — L03114 Cellulitis of left upper limb: Secondary | ICD-10-CM | POA: Diagnosis not present

## 2023-11-25 DIAGNOSIS — T63301A Toxic effect of unspecified spider venom, accidental (unintentional), initial encounter: Secondary | ICD-10-CM | POA: Diagnosis not present

## 2024-11-01 DIAGNOSIS — J069 Acute upper respiratory infection, unspecified: Secondary | ICD-10-CM | POA: Diagnosis not present

## 2024-11-01 DIAGNOSIS — R0982 Postnasal drip: Secondary | ICD-10-CM | POA: Diagnosis not present

## 2024-11-01 DIAGNOSIS — J209 Acute bronchitis, unspecified: Secondary | ICD-10-CM | POA: Diagnosis not present
